# Patient Record
Sex: Female | Born: 1970
Health system: Southern US, Community
[De-identification: ages and names within clinical notes are randomized; demographics above are authoritative.]

## PROBLEM LIST (undated history)

## (undated) DIAGNOSIS — F329 Major depressive disorder, single episode, unspecified: Secondary | ICD-10-CM

## (undated) DIAGNOSIS — D259 Leiomyoma of uterus, unspecified: Secondary | ICD-10-CM

## (undated) DIAGNOSIS — N979 Female infertility, unspecified: Secondary | ICD-10-CM

## (undated) DIAGNOSIS — F32A Depression, unspecified: Secondary | ICD-10-CM

## (undated) DIAGNOSIS — E739 Lactose intolerance, unspecified: Secondary | ICD-10-CM

## (undated) DIAGNOSIS — K589 Irritable bowel syndrome without diarrhea: Secondary | ICD-10-CM

## (undated) DIAGNOSIS — I341 Nonrheumatic mitral (valve) prolapse: Secondary | ICD-10-CM

## (undated) DIAGNOSIS — M5136 Other intervertebral disc degeneration, lumbar region: Secondary | ICD-10-CM

## (undated) DIAGNOSIS — M51369 Other intervertebral disc degeneration, lumbar region without mention of lumbar back pain or lower extremity pain: Secondary | ICD-10-CM

## (undated) HISTORY — PX: WISDOM TOOTH EXTRACTION: SHX21

## (undated) HISTORY — DX: Irritable bowel syndrome, unspecified: K58.9

## (undated) HISTORY — DX: Depression, unspecified: F32.A

## (undated) HISTORY — PX: LAPAROSCOPY: SHX197

## (undated) HISTORY — DX: Lactose intolerance, unspecified: E73.9

## (undated) HISTORY — DX: Major depressive disorder, single episode, unspecified: F32.9

## (undated) HISTORY — DX: Leiomyoma of uterus, unspecified: D25.9

## (undated) HISTORY — DX: Nonrheumatic mitral (valve) prolapse: I34.1

## (undated) HISTORY — DX: Female infertility, unspecified: N97.9

---

## 2001-09-02 ENCOUNTER — Ambulatory Visit (HOSPITAL_COMMUNITY): Admission: RE | Admit: 2001-09-02 | Discharge: 2001-09-02 | Payer: Self-pay | Admitting: Obstetrics and Gynecology

## 2001-09-02 ENCOUNTER — Encounter: Payer: Self-pay | Admitting: Obstetrics and Gynecology

## 2002-02-04 ENCOUNTER — Inpatient Hospital Stay (HOSPITAL_COMMUNITY): Admission: AD | Admit: 2002-02-04 | Discharge: 2002-02-07 | Payer: Self-pay | Admitting: Obstetrics and Gynecology

## 2002-03-18 ENCOUNTER — Other Ambulatory Visit: Admission: RE | Admit: 2002-03-18 | Discharge: 2002-03-18 | Payer: Self-pay | Admitting: Obstetrics and Gynecology

## 2003-06-24 ENCOUNTER — Other Ambulatory Visit: Admission: RE | Admit: 2003-06-24 | Discharge: 2003-06-24 | Payer: Self-pay | Admitting: Obstetrics and Gynecology

## 2004-10-21 ENCOUNTER — Other Ambulatory Visit: Admission: RE | Admit: 2004-10-21 | Discharge: 2004-10-21 | Payer: Self-pay | Admitting: Obstetrics and Gynecology

## 2006-01-19 ENCOUNTER — Other Ambulatory Visit: Admission: RE | Admit: 2006-01-19 | Discharge: 2006-01-19 | Payer: Self-pay | Admitting: Obstetrics and Gynecology

## 2008-06-26 ENCOUNTER — Ambulatory Visit: Payer: Self-pay | Admitting: Internal Medicine

## 2008-10-16 ENCOUNTER — Ambulatory Visit: Payer: Self-pay | Admitting: Internal Medicine

## 2009-01-29 ENCOUNTER — Ambulatory Visit: Payer: Self-pay | Admitting: Internal Medicine

## 2009-03-12 ENCOUNTER — Ambulatory Visit: Payer: Self-pay

## 2009-04-22 ENCOUNTER — Ambulatory Visit: Payer: Self-pay

## 2009-05-06 ENCOUNTER — Ambulatory Visit: Payer: Self-pay

## 2010-11-17 ENCOUNTER — Ambulatory Visit: Payer: Self-pay | Admitting: Cardiovascular Disease

## 2010-11-29 ENCOUNTER — Encounter: Payer: Self-pay | Admitting: Cardiovascular Disease

## 2010-11-29 ENCOUNTER — Ambulatory Visit (INDEPENDENT_AMBULATORY_CARE_PROVIDER_SITE_OTHER): Payer: Managed Care, Other (non HMO) | Admitting: Cardiovascular Disease

## 2010-11-29 DIAGNOSIS — R002 Palpitations: Secondary | ICD-10-CM | POA: Insufficient documentation

## 2010-11-29 DIAGNOSIS — I059 Rheumatic mitral valve disease, unspecified: Secondary | ICD-10-CM | POA: Insufficient documentation

## 2010-11-29 DIAGNOSIS — R079 Chest pain, unspecified: Secondary | ICD-10-CM

## 2010-12-02 ENCOUNTER — Ambulatory Visit: Payer: Self-pay | Admitting: Cardiovascular Disease

## 2010-12-02 DIAGNOSIS — R002 Palpitations: Secondary | ICD-10-CM

## 2010-12-06 NOTE — Assessment & Plan Note (Signed)
Summary: Ref Dr. Darrick Huntsman Dx. MVP/ recurrent atypical chest pain   Visit Type:  Initial Consult Primary Provider:  Dr. Darrick Huntsman  CC:  pt is here to establish care and dx with mitral valve prolapse as a child. c/o chest pain. Denies SOB.Suzanne Foster  History of Present Illness: 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 Darrick Huntsman, who presents by referral for evaluation of chest pain and palpitations.  She reports that her symptoms of chest pain started a long time ago. More recently, her episodes have been increasing in frequency. It used to be once every several months, then once a month, now once a week. She describes it as brief, lasting several seconds. Typically comes on at rest, 8/10 in intensity. It is a shooting-type sharp pain over the left breast that is deep. He can take her breath away. Symptoms are worse with deep inspiration. She denies any cough, lightheadedness, diaphoresis or radiating pain.  She also has palpitations that have been on daily basis. Typically comes on it rest. She's had these for a long time. Denies any tachycardia palpitations him a chest extra beats.  She denies excess caffeine, normal thyroid in October last year. She reports parents have atrial fibrillation. She also reports a remote history of mitral valve prolapse.  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  The  patient denies fever, weight loss, weight gain, vision loss, decreased hearing, hoarseness, syncope, dyspnea on exertion, peripheral edema, prolonged cough, abdominal pain, incontinence, muscle weakness, depression, and enlarged lymph nodes.         palpitations  Physical Exam  General:  height 5 foot 10, weight 193 pounds, blood pressure 120/60, pulse 89, BMI 27.8  Head:  normocephalic and atraumatic Eyes:  PERRLA/EOM intact; conjunctiva and lids normal. Neck:  Neck supple, no JVD. No masses, thyromegaly or abnormal cervical nodes. Lungs:  Clear bilaterally to auscultation and percussion. Heart:  Non-displaced PMI, chest non-tender; regular rate and rhythm, S1, S2 without murmurs, rubs or gallops. Carotid upstroke normal, no bruit. Pedals normal pulses. No edema, no varicosities. Abdomen:  Bowel sounds positive; abdomen soft and non-tender without masses Msk:  Back normal, normal gait. Muscle strength and tone normal. Pulses:  pulses normal in all 4 extremities Extremities:  No clubbing or cyanosis. Neurologic:  Alert and oriented x 3. Skin:  Intact without lesions or rashes. Psych:  Normal affect.   Impression & Recommendations:  Problem # 1:  PALPITATIONS (ICD-785.1) She does have palpitations and we have ordered a Holter monitor. I suspect she could have APCs or PVCs. She is not interested in low-dose beta blocker at this time. She does have a history of mitral valve prolapse.   Orders: Echocardiogram (Echo) Holter (Holter)  Problem # 2:  CHEST PAIN-UNSPECIFIED (ICD-786.50) Etiology of her chest pain is uncertain. There certainly numerous things on the differential including atelectasis, chest wall pain/musculoskeletal issues, coronary spasm,  etc. She does not have significant risk factors for coronary artery disease. And she is otherwise active with no symptoms. Will hold off on any stress testing at this time. Given her history of mitral valve prolapse and recent  palpitations, an echocardiogram has been ordered. These will be done at the [facility] as she is employed there.  Problem # 3:  MITRAL VALVE PROLAPSE (ICD-424.0) echocardiogram will be ordered to evaluate her mitral valve and to exclude progression of her valve prolapse.  Orders: Echocardiogram (Echo)  New Orders:     1)  Echocardiogram (Echo)     2)  Holter (Holter)   Patient Instructions: 1)  Your physician recommends that you follow up as needed. 2)  Your physician recommends that you continue on your current medications as directed. Please refer to the Current Medication list given to you today. 3)  TO BE DONE AT [facility]: Your physician has requested that you have an echocardiogram.  Echocardiography is a painless test that uses sound waves to create images of your heart. It provides your doctor with information about the size and shape of your heart and how well your heart's chambers and valves are working.  This procedure takes approximately one hour. There are no restrictions for this procedure. 4)  WE WILL ORDER AT [facility]: Your physician has recommended that you wear a holter monitor.  Holter monitors are medical devices that record the heart's electrical activity. Doctors most often use these monitors to diagnose arrhythmias. Arrhythmias are problems with the speed or rhythm of the heartbeat. The monitor is a small, portable device. You can wear one while you do your normal daily activities. This is usually used to diagnose what is causing palpitations/syncope (passing out).

## 2010-12-20 ENCOUNTER — Telehealth: Payer: Self-pay | Admitting: Cardiovascular Disease

## 2010-12-20 NOTE — Telephone Encounter (Signed)
Would like results of ECHO and Holter Monitor.

## 2010-12-20 NOTE — Telephone Encounter (Signed)
Pt notified that echo was essentially normal and that Holter monitor was overall NSR with rare PVC and short run of sinus tach. Notified pt unless she was symptomatic and wanted to schedule office visit with Dr. Mariah Milling to be started on medication then nothing needed to be done. Pt states she will monitor symptoms for now and will call if wants to schedule appt.

## 2011-01-05 ENCOUNTER — Encounter: Payer: Self-pay | Admitting: Cardiovascular Disease

## 2011-01-17 ENCOUNTER — Encounter: Payer: Self-pay | Admitting: Cardiovascular Disease

## 2012-05-17 ENCOUNTER — Ambulatory Visit: Payer: Managed Care, Other (non HMO) | Admitting: Gastroenterology

## 2012-06-07 ENCOUNTER — Other Ambulatory Visit (INDEPENDENT_AMBULATORY_CARE_PROVIDER_SITE_OTHER): Payer: Managed Care, Other (non HMO)

## 2012-06-07 ENCOUNTER — Ambulatory Visit (INDEPENDENT_AMBULATORY_CARE_PROVIDER_SITE_OTHER): Payer: Managed Care, Other (non HMO) | Admitting: Gastroenterology

## 2012-06-07 ENCOUNTER — Encounter: Payer: Self-pay | Admitting: Gastroenterology

## 2012-06-07 VITALS — BP 118/72 | HR 80 | Ht 69.5 in | Wt 190.5 lb

## 2012-06-07 DIAGNOSIS — R197 Diarrhea, unspecified: Secondary | ICD-10-CM

## 2012-06-07 DIAGNOSIS — R109 Unspecified abdominal pain: Secondary | ICD-10-CM

## 2012-06-07 LAB — CBC WITH DIFFERENTIAL/PLATELET
Basophils Relative: 0.6 % (ref 0.0–3.0)
Eosinophils Absolute: 0.1 10*3/uL (ref 0.0–0.7)
Eosinophils Relative: 1.5 % (ref 0.0–5.0)
Lymphocytes Relative: 19.7 % (ref 12.0–46.0)
MCHC: 33.5 g/dL (ref 30.0–36.0)
Monocytes Relative: 4.3 % (ref 3.0–12.0)
Neutrophils Relative %: 73.9 % (ref 43.0–77.0)
RBC: 4.62 Mil/uL (ref 3.87–5.11)
WBC: 5.9 10*3/uL (ref 4.5–10.5)

## 2012-06-07 LAB — COMPREHENSIVE METABOLIC PANEL
ALT: 13 U/L (ref 0–35)
AST: 20 U/L (ref 0–37)
Albumin: 4 g/dL (ref 3.5–5.2)
Alkaline Phosphatase: 44 U/L (ref 39–117)
BUN: 13 mg/dL (ref 6–23)
CO2: 25 mEq/L (ref 19–32)
Calcium: 9.2 mg/dL (ref 8.4–10.5)
Chloride: 104 mEq/L (ref 96–112)
Creatinine, Ser: 0.9 mg/dL (ref 0.4–1.2)
GFR: 78.73 mL/min (ref >60.00)
Glucose, Bld: 114 mg/dL — ABNORMAL HIGH (ref 70–99)
Potassium: 4.1 mEq/L (ref 3.5–5.1)
Sodium: 138 mEq/L (ref 135–145)
Total Bilirubin: 0.5 mg/dL (ref 0.3–1.2)
Total Protein: 7.2 g/dL (ref 6.0–8.3)

## 2012-06-07 NOTE — Patient Instructions (Addendum)
You will have labs checked today in the basement lab.  Please head down after you check out with the front desk  (cbc, cmet, celiac sprue panel, tsh). Await the above results to decide next steps.

## 2012-06-07 NOTE — Progress Notes (Signed)
HPI: This is a    very pleasant 41 year old woman whom I am meeting for the first time today.  She is on Prozac and a #1 side effect of this medicine is nausea  About 20 years ago she saw a GI in [place], sigmoidoscopiy and barium done. She was told she had lactose intolerance and IBS.  Continues to have same symptoms:  Post prandial severe abd cramping, gassy diarrhea, then symptoms gone.  Has had stressfull year with exacerbation of symptoms.  Also new burning and dull ache in epigastrium, usually also post prandial.  Periumbilical sharp pain.  Has been eating greek yogurt and most symptoms have improved almost cancelled the appt.  THis AM ate a biscquit  She knows she can eat some dairy.  Lactaid trials DON'T really help.   Infrequent blood in stool, red, every 1-2 months. Feels it is a hemorrhoid, usually associated with tearing.  Usually has one BM every morning.   The episodes occur 3 times a week.  Has not   Her brother diagnosed with gluten allergy recently.   Review of systems: Pertinent positive and negative review of systems were noted in the above HPI section. Complete review of systems was performed and was otherwise normal.    Past Medical History  Diagnosis Date  . Infertility, female   . Mitral valve prolapse     dx age 61, had echo done 10 years ago in [facility]  . IBS (irritable bowel syndrome)   . Lactose intolerance   . Uterine fibroid   . Depression     Past Surgical History  Procedure Date  . Cesarean section     x1   . Laparoscopy     x 2 for endometriosis, and ovarian cysts  . Wisdom tooth extraction     Current Outpatient Prescriptions  Medication Sig Dispense Refill  . FLUoxetine (PROZAC) 20 MG capsule Take 40 mg by mouth daily.       . Norethindrone-Ethinyl Estradiol-Fe (GENERESS FE) 0.8-25 MG-MCG tablet Chew 1 tablet by mouth daily.        Allergies as of 06/07/2012  . (No Known Allergies)    Family History  Problem Relation Age  of Onset  . Atrial fibrillation Mother   . Atrial fibrillation Father     CHF, post ablative  . Breast cancer Paternal Grandmother   . Uterine cancer Paternal Grandmother   . Thyroid cancer Paternal Grandmother     History   Social History  . Marital Status: Married    Spouse Name: N/A    Number of Children: 1  . Years of Education: N/A   Occupational History  . Social Worker/Care Mgr    Social History Main Topics  . Smoking status: Never Smoker   . Smokeless tobacco: Never Used  . Alcohol Use: Yes     1 every 6 months  . Drug Use: No  . Sexually Active: Not on file   Other Topics Concern  . Not on file   Social History Narrative  . No narrative on file       Physical Exam: BP 118/72  Pulse 80  Ht 5' 9.5" (1.765 m)  Wt 190 lb 8 oz (86.41 kg)  BMI 27.73 kg/m2  LMP 05/17/2012 Constitutional: generally well-appearing Psychiatric: alert and oriented x3 Eyes: extraocular movements intact Mouth: oral pharynx moist, no lesions Neck: supple no lymphadenopathy Cardiovascular: heart regular rate and rhythm Lungs: clear to auscultation bilaterally Abdomen: soft, nontender, nondistended, no obvious  ascites, no peritoneal signs, normal bowel sounds Extremities: no lower extremity edema bilaterally Skin: no lesions on visible extremities    Assessment and plan: 41 y.o. female with  intermittent abdominal pain, diarrhea, also intermittent rectal bleeding  This may simply be IBS related. Of interest, her brother was recently diagnosed with gluten allergy and so perhaps her symptoms could be attributed to celiac sprue. She is going to have a basic set of labs today including CBC, complete metabolic profile, thyroid testing and also celiac sprue panel. Pending those results I would proceed either with upper endoscopy to confirm celiac sprue or colonoscopy if the results are all unrevealing. She did mention some new epigastric intermittent postprandial pain. Perhaps this is  gallbladder related however that would not explain the rest of her symptoms and    we will keep that in the back of our minds for now.

## 2012-06-10 LAB — CELIAC PANEL 10
Endomysial Screen: NEGATIVE
Gliadin IgA: 8.1 U/mL (ref <20)
IgA: 97 mg/dL (ref 69–380)

## 2012-06-20 ENCOUNTER — Other Ambulatory Visit (INDEPENDENT_AMBULATORY_CARE_PROVIDER_SITE_OTHER): Payer: Self-pay | Admitting: Internal Medicine

## 2013-06-27 ENCOUNTER — Other Ambulatory Visit (INDEPENDENT_AMBULATORY_CARE_PROVIDER_SITE_OTHER): Payer: Self-pay | Admitting: Internal Medicine

## 2013-09-04 ENCOUNTER — Ambulatory Visit: Payer: Self-pay

## 2013-09-15 ENCOUNTER — Telehealth: Payer: Self-pay | Admitting: *Deleted

## 2013-09-15 NOTE — Telephone Encounter (Signed)
Patient called and she had blurry vision, dizzy yesterday bp 140/90 pulse was 61 normally in the 80's.  158/92 this morning. Please advise.

## 2013-09-15 NOTE — Telephone Encounter (Signed)
Left voicemail for pt that we have not seen her since 11/2010 and she will need to make an appt.

## 2014-05-27 ENCOUNTER — Ambulatory Visit: Payer: Self-pay

## 2014-06-03 ENCOUNTER — Ambulatory Visit: Payer: Self-pay | Admitting: Sports Medicine

## 2014-06-18 ENCOUNTER — Ambulatory Visit: Payer: Self-pay

## 2015-03-23 ENCOUNTER — Other Ambulatory Visit: Payer: Self-pay | Admitting: Obstetrics and Gynecology

## 2015-03-23 DIAGNOSIS — Z1231 Encounter for screening mammogram for malignant neoplasm of breast: Secondary | ICD-10-CM

## 2015-04-09 ENCOUNTER — Ambulatory Visit: Payer: Self-pay

## 2015-04-13 ENCOUNTER — Other Ambulatory Visit: Payer: Self-pay | Admitting: Obstetrics and Gynecology

## 2015-04-13 ENCOUNTER — Ambulatory Visit
Admission: RE | Admit: 2015-04-13 | Discharge: 2015-04-13 | Disposition: A | Discharge status: Home / Self Care | Payer: 59 | Source: Ambulatory Visit | Attending: Obstetrics and Gynecology | Admitting: Obstetrics and Gynecology

## 2015-04-13 DIAGNOSIS — Z1231 Encounter for screening mammogram for malignant neoplasm of breast: Secondary | ICD-10-CM

## 2015-12-06 DIAGNOSIS — F331 Major depressive disorder, recurrent, moderate: Secondary | ICD-10-CM | POA: Diagnosis not present

## 2016-01-04 DIAGNOSIS — F331 Major depressive disorder, recurrent, moderate: Secondary | ICD-10-CM | POA: Diagnosis not present

## 2016-02-15 DIAGNOSIS — F331 Major depressive disorder, recurrent, moderate: Secondary | ICD-10-CM | POA: Diagnosis not present

## 2016-03-24 ENCOUNTER — Other Ambulatory Visit: Payer: Self-pay | Admitting: Obstetrics and Gynecology

## 2016-03-24 DIAGNOSIS — Z1231 Encounter for screening mammogram for malignant neoplasm of breast: Secondary | ICD-10-CM

## 2016-04-11 DIAGNOSIS — K219 Gastro-esophageal reflux disease without esophagitis: Secondary | ICD-10-CM | POA: Diagnosis not present

## 2016-04-11 DIAGNOSIS — R1013 Epigastric pain: Secondary | ICD-10-CM | POA: Diagnosis not present

## 2016-04-11 DIAGNOSIS — Z1211 Encounter for screening for malignant neoplasm of colon: Secondary | ICD-10-CM | POA: Diagnosis not present

## 2016-04-11 DIAGNOSIS — R1310 Dysphagia, unspecified: Secondary | ICD-10-CM | POA: Diagnosis not present

## 2016-04-13 ENCOUNTER — Ambulatory Visit: Payer: 59 | Attending: Obstetrics and Gynecology

## 2016-04-20 ENCOUNTER — Ambulatory Visit
Admission: RE | Admit: 2016-04-20 | Discharge: 2016-04-20 | Disposition: A | Discharge status: Home / Self Care | Payer: 59 | Source: Ambulatory Visit | Attending: Obstetrics and Gynecology | Admitting: Obstetrics and Gynecology

## 2016-04-20 ENCOUNTER — Other Ambulatory Visit: Payer: Self-pay | Admitting: Obstetrics and Gynecology

## 2016-04-20 DIAGNOSIS — Z1231 Encounter for screening mammogram for malignant neoplasm of breast: Secondary | ICD-10-CM

## 2016-05-09 DIAGNOSIS — F331 Major depressive disorder, recurrent, moderate: Secondary | ICD-10-CM | POA: Diagnosis not present

## 2016-05-19 ENCOUNTER — Encounter (HOSPITAL_COMMUNITY): Payer: Self-pay

## 2016-05-19 ENCOUNTER — Ambulatory Visit (HOSPITAL_COMMUNITY): Admit: 2016-05-19 | Payer: 59 | Admitting: Gastroenterology

## 2016-05-19 SURGERY — ESOPHAGOGASTRODUODENOSCOPY (EGD) WITH PROPOFOL
Anesthesia: Monitor Anesthesia Care

## 2016-07-03 DIAGNOSIS — N920 Excessive and frequent menstruation with regular cycle: Secondary | ICD-10-CM | POA: Diagnosis not present

## 2016-07-03 DIAGNOSIS — N939 Abnormal uterine and vaginal bleeding, unspecified: Secondary | ICD-10-CM | POA: Diagnosis not present

## 2016-07-03 DIAGNOSIS — Z0000 Encounter for general adult medical examination without abnormal findings: Secondary | ICD-10-CM | POA: Diagnosis not present

## 2016-07-03 DIAGNOSIS — Z01419 Encounter for gynecological examination (general) (routine) without abnormal findings: Secondary | ICD-10-CM | POA: Diagnosis not present

## 2016-07-03 DIAGNOSIS — N946 Dysmenorrhea, unspecified: Secondary | ICD-10-CM | POA: Diagnosis not present

## 2016-07-03 DIAGNOSIS — Z6829 Body mass index (BMI) 29.0-29.9, adult: Secondary | ICD-10-CM | POA: Diagnosis not present

## 2016-07-13 ENCOUNTER — Other Ambulatory Visit: Payer: Self-pay | Admitting: *Deleted

## 2016-07-13 ENCOUNTER — Ambulatory Visit (INDEPENDENT_AMBULATORY_CARE_PROVIDER_SITE_OTHER): Payer: 59

## 2016-07-13 ENCOUNTER — Encounter: Payer: Self-pay | Admitting: Podiatry

## 2016-07-13 ENCOUNTER — Ambulatory Visit (INDEPENDENT_AMBULATORY_CARE_PROVIDER_SITE_OTHER): Payer: 59 | Admitting: Podiatry

## 2016-07-13 DIAGNOSIS — M722 Plantar fascial fibromatosis: Secondary | ICD-10-CM

## 2016-07-13 DIAGNOSIS — M79672 Pain in left foot: Secondary | ICD-10-CM | POA: Diagnosis not present

## 2016-07-13 DIAGNOSIS — L603 Nail dystrophy: Secondary | ICD-10-CM

## 2016-07-13 DIAGNOSIS — M79671 Pain in right foot: Secondary | ICD-10-CM | POA: Diagnosis not present

## 2016-07-13 MED ORDER — METHYLPREDNISOLONE 4 MG PO TBPK: ORAL_TABLET | ORAL | 0 refills | Status: DC

## 2016-07-13 MED ORDER — MELOXICAM 15 MG PO TABS: 15.0000 mg | ORAL_TABLET | Freq: Every day | ORAL | 3 refills | Status: DC

## 2016-07-13 NOTE — Patient Instructions (Signed)

## 2016-07-13 NOTE — Progress Notes (Signed)
   Subjective:    Patient ID: Suzanne Foster, female    DOB: 02-29-1971, 45 y.o.   MRN: [id number]  HPI: She presents today with concern about the hallux nail left. States it has been thickened for quite some time and use an over-the-counter counter agent called Curasol which seemed to have resolved the problem. She states that however recently she has noted that he seems to be returning. She's also complaining of bilateral heel and arch pain. She states is been aching for the past few weeks mornings are particularly bad she feels that it is primarily because of her increased exercise regimen. She states that she has felt better since she stopped exercising and she's been taking ibuprofen as needed.  Review of Systems  Gastrointestinal: Positive for abdominal pain.  Musculoskeletal: Positive for arthralgias.  Allergic/Immunologic: Positive for environmental allergies.  All other systems reviewed and are negative.      Objective:   Physical Exam: She presents today vital signs stable alert and oriented 3 pulses are strongly palpable. Neurologic sensorium is intact. Deep tendon reflexes are intact. Muscle strength +5 over 5 dorsiflexion plantar flexors and inverters everters onto the musculature is intact. Orthopedic evaluation of his resolved joints distal to the ankle range of motion without crepitation. Cutaneous evaluation demonstrates a well-hydrated cutis possible onychomycosis to the distal lateral nail margin without discoloration. She has pain on palpation medial calcaneal tubercles bilateral. Radiographs taken today do not demonstrate any type of major osseous abnormalities of the soft tissue increase in density of the plantar fascia cranial insertion sites.        Assessment & Plan:  Assessment: Pain in limb secondary to onychomycosis hallux left. Pain in limb plantar fasciitis bilateral.  Plan: Injected the bilateral heels today and placed her in bilateral plantar fascia braces.  Discussed appropriate shoe gear Street #Zeiss therapy and shoe modifications. I also recommended that she continue the over-the-counter treatment with Curasol until I follow up with her in 1 month. If not improved at that time we'll take samples sent it to the lab.

## 2016-07-14 ENCOUNTER — Other Ambulatory Visit: Payer: Self-pay | Admitting: Gastroenterology

## 2016-07-27 ENCOUNTER — Encounter (HOSPITAL_COMMUNITY): Payer: Self-pay | Admitting: *Deleted

## 2016-08-01 ENCOUNTER — Other Ambulatory Visit: Payer: Self-pay | Admitting: Gastroenterology

## 2016-08-02 ENCOUNTER — Encounter (HOSPITAL_COMMUNITY)
Admission: RE | Disposition: A | Discharge status: Home / Self Care | Payer: Self-pay | Source: Ambulatory Visit | Attending: Gastroenterology

## 2016-08-02 ENCOUNTER — Ambulatory Visit (HOSPITAL_COMMUNITY): Payer: 59 | Admitting: Registered Nurse

## 2016-08-02 ENCOUNTER — Encounter (HOSPITAL_COMMUNITY): Payer: Self-pay

## 2016-08-02 ENCOUNTER — Ambulatory Visit (HOSPITAL_COMMUNITY)
Admission: RE | Admit: 2016-08-02 | Discharge: 2016-08-02 | Disposition: A | Discharge status: Home / Self Care | Payer: 59 | Source: Ambulatory Visit | Attending: Gastroenterology | Admitting: Gastroenterology

## 2016-08-02 DIAGNOSIS — R1310 Dysphagia, unspecified: Secondary | ICD-10-CM | POA: Diagnosis not present

## 2016-08-02 DIAGNOSIS — K219 Gastro-esophageal reflux disease without esophagitis: Secondary | ICD-10-CM | POA: Insufficient documentation

## 2016-08-02 DIAGNOSIS — Z79899 Other long term (current) drug therapy: Secondary | ICD-10-CM | POA: Insufficient documentation

## 2016-08-02 DIAGNOSIS — K2970 Gastritis, unspecified, without bleeding: Secondary | ICD-10-CM | POA: Insufficient documentation

## 2016-08-02 HISTORY — PX: ESOPHAGOGASTRODUODENOSCOPY (EGD) WITH PROPOFOL: SHX5813

## 2016-08-02 HISTORY — DX: Other intervertebral disc degeneration, lumbar region: M51.36

## 2016-08-02 HISTORY — PX: BALLOON DILATION: SHX5330

## 2016-08-02 HISTORY — DX: Other intervertebral disc degeneration, lumbar region without mention of lumbar back pain or lower extremity pain: M51.369

## 2016-08-02 SURGERY — ESOPHAGOGASTRODUODENOSCOPY (EGD) WITH PROPOFOL
Anesthesia: Monitor Anesthesia Care

## 2016-08-02 MED ORDER — LIDOCAINE 2% (20 MG/ML) 5 ML SYRINGE
INTRAMUSCULAR | Status: AC
  Filled 2016-08-02: qty 5

## 2016-08-02 MED ORDER — PROPOFOL 10 MG/ML IV BOLUS
INTRAVENOUS | Status: AC
  Filled 2016-08-02: qty 20

## 2016-08-02 MED ORDER — PROPOFOL 10 MG/ML IV BOLUS
INTRAVENOUS | Status: DC | PRN
  Administered 2016-08-02 (×2): 20 mg via INTRAVENOUS
  Administered 2016-08-02 (×2): 40 mg via INTRAVENOUS

## 2016-08-02 MED ORDER — LACTATED RINGERS IV SOLN
INTRAVENOUS | Status: DC | PRN
  Administered 2016-08-02: 09:00:00 via INTRAVENOUS

## 2016-08-02 MED ORDER — PROPOFOL 10 MG/ML IV BOLUS
INTRAVENOUS | Status: AC
  Filled 2016-08-02: qty 40

## 2016-08-02 MED ORDER — LIDOCAINE 2% (20 MG/ML) 5 ML SYRINGE
INTRAMUSCULAR | Status: DC | PRN
  Administered 2016-08-02: 100 mg via INTRAVENOUS

## 2016-08-02 MED ORDER — BUTAMBEN-TETRACAINE-BENZOCAINE 2-2-14 % EX AERO
INHALATION_SPRAY | CUTANEOUS | Status: DC | PRN
Start: 2016-08-02 — End: 2016-08-02
  Administered 2016-08-02: 1 via TOPICAL

## 2016-08-02 MED ORDER — PROPOFOL 500 MG/50ML IV EMUL
INTRAVENOUS | Status: DC | PRN
  Administered 2016-08-02: 130 ug/kg/min via INTRAVENOUS

## 2016-08-02 SURGICAL SUPPLY — 15 items

## 2016-08-02 NOTE — Op Note (Signed)
[facility] Patient Name: Suzanne Foster Procedure Date: 08/02/2016 MRN: [id number] Attending MD: Arta Silence , MD Date of Birth: [birth year] CSN: [id number] Age: 45 Admit Type: Outpatient Procedure:                Upper GI endoscopy Indications:              Dysphagia, Suspected gastro-esophageal reflux                            disease Providers:                Arta Silence, MD, Jobe Igo, RN, Ralene Bathe, Technician, Courtney Heys. Arm[place]ead, CRNA Referring MD:              Medicines:                Monitored Anesthesia Care Complications:            No immediate complications. Estimated Blood Loss:     Estimated blood loss: none. Procedure:                Pre-Anesthesia Assessment:                           - Prior to the procedure, a History and Physical                            was performed, and patient medications and                            allergies were reviewed. The patient's tolerance of                            previous anesthesia was also reviewed. The risks                            and benefits of the procedure and the sedation                            options and risks were discussed with the patient.                            All questions were answered, and informed consent                            was obtained. Prior Anticoagulants: The patient has                            taken no previous anticoagulant or antiplatelet                            agents. ASA Grade Assessment: II - A patient with                            mild  systemic disease. After reviewing the risks                            and benefits, the patient was deemed in                            satisfactory condition to undergo the procedure.                           After obtaining informed consent, the endoscope was                            passed under direct vision. Throughout the                            procedure, the  patient's blood pressure, pulse, and                            oxygen saturations were monitored continuously. The                            Endoscope was introduced through the mouth, and                            advanced to the second part of duodenum. The upper                            GI endoscopy was accomplished without difficulty.                            The patient tolerated the procedure well. Scope In: Scope Out: Findings:      The examined esophagus was normal. No esophageal mass, stricture,       varices or Barrett's mucosa was identified. No mucosal features       suggestive of eosinophilic esophagitis were identified.      Patchy minimal inflammation was found in the prepyloric region of the       stomach.      The exam of the stomach was otherwise normal.      The duodenal bulb, first portion of the duodenum and second portion of       the duodenum were normal. Impression:               - Normal esophagus. Suspect dysphagia is                            GERD-mediated.                           - Gastritis.                           - Normal duodenal bulb, first portion of the                            duodenum and second portion of the duodenum. Moderate Sedation:  None Recommendation:           - Patient has a contact number available for                            emergencies. The signs and symptoms of potential                            delayed complications were discussed with the                            patient. Return to normal activities tomorrow.                            Written discharge instructions were provided to the                            patient.                           - Discharge patient to home (ambulatory).                           - Resume previous diet today.                           - Follow an antireflux regimen indefinitely.                           - Continue present medications.                           - Return  to GI clinic PRN.                           - Return to referring physician as previously                            scheduled. Procedure Code(s):        --- Professional ---                           [phone], Esophagogastroduodenoscopy, flexible,                            transoral; diagnostic, including collection of                            specimen(s) by brushing or washing, when performed                            (separate procedure) Diagnosis Code(s):        --- Professional ---                           K29.70, Gastritis, unspecified, without bleeding  R13.10, Dysphagia, unspecified CPT copyright 2016 American Medical Association. All rights reserved. The codes documented in this report are preliminary and upon coder review may  be revised to meet current compliance requirements. Arta Silence, MD 08/02/2016 9:59:54 AM This report has been signed electronically. Number of Addenda: 0

## 2016-08-02 NOTE — H&P (Signed)
Patient interval history reviewed.  Patient examined again.  There has been no change from documented H/P dated 08/01/16 (scanned into chart from our office) except as documented above.  Assessment:  1.  GERD. 2.  Intermittent solid-predominant dysphagia. 3.  Globus sensation.  Plan:  1.  Endoscopy with possible esophageal dilatation. 2.  Risks (bleeding, infection, bowel perforation that could require surgery, sedation-related changes in cardiopulmonary systems), benefits (identification and possible treatment of source of symptoms, exclusion of certain causes of symptoms), and alternatives (watchful waiting, radiographic imaging studies, empiric medical treatment) of upper endoscopy with possible esophageal dilatation (EGD +/- DIL) were explained to patient/family in detail and patient wishes to proceed.

## 2016-08-02 NOTE — Transfer of Care (Signed)
Immediate Anesthesia Transfer of Care Note  Patient: Suzanne Foster  Procedure(s) Performed: Procedure(s): ESOPHAGOGASTRODUODENOSCOPY (EGD) WITH PROPOFOL (N/A) BALLOON DILATION (N/A)  Patient Location: PACU and Endoscopy Unit  Anesthesia Type:MAC  Level of Consciousness: awake, alert , oriented and patient cooperative  Airway & Oxygen Therapy: Patient Spontanous Breathing and Patient connected to nasal cannula oxygen  Post-op Assessment: Report given to RN, Post -op Vital signs reviewed and stable and Patient moving all extremities  Post vital signs: Reviewed and stable  Last Vitals:  Vitals:   08/02/16 0825  BP: 128/64  Pulse: 73  Resp: 18  Temp: 36.7 C    Last Pain:  Vitals:   08/02/16 0825  TempSrc: Oral         Complications: No apparent anesthesia complications

## 2016-08-02 NOTE — Anesthesia Preprocedure Evaluation (Addendum)
Anesthesia Evaluation  Patient identified by MRN, date of birth, ID band Patient awake    Reviewed: Allergy & Precautions, NPO status , Patient's Chart, lab work & pertinent test results  Airway Mallampati: II  TM Distance: >3 FB Neck ROM: Full    Dental no notable dental hx.    Pulmonary neg pulmonary ROS,    Pulmonary exam normal breath sounds clear to auscultation       Cardiovascular Normal cardiovascular exam+ Valvular Problems/Murmurs MVP  Rhythm:Regular Rate:Normal     Neuro/Psych negative neurological ROS  negative psych ROS   GI/Hepatic negative GI ROS, Neg liver ROS,   Endo/Other  negative endocrine ROS  Renal/GU negative Renal ROS     Musculoskeletal  (+) Arthritis ,   Abdominal   Peds  Hematology negative hematology ROS (+)   Anesthesia Other Findings   Reproductive/Obstetrics negative OB ROS                             Anesthesia Physical Anesthesia Plan  ASA: II  Anesthesia Plan: MAC   Post-op Pain Management:    Induction:   Airway Management Planned:   Additional Equipment:   Intra-op Plan:   Post-operative Plan:   Informed Consent: I have reviewed the patients History and Physical, chart, labs and discussed the procedure including the risks, benefits and alternatives for the proposed anesthesia with the patient or authorized representative who has indicated his/her understanding and acceptance.   Dental advisory given  Plan Discussed with: CRNA  Anesthesia Plan Comments:         Anesthesia Quick Evaluation

## 2016-08-02 NOTE — Anesthesia Postprocedure Evaluation (Signed)
Anesthesia Post Note  Patient: LIANI CARIS  Procedure(s) Performed: Procedure(s) (LRB): ESOPHAGOGASTRODUODENOSCOPY (EGD) WITH PROPOFOL (N/A) BALLOON DILATION (N/A)  Patient location during evaluation: PACU Anesthesia Type: MAC Level of consciousness: awake and alert Pain management: pain level controlled Vital Signs Assessment: post-procedure vital signs reviewed and stable Respiratory status: spontaneous breathing Cardiovascular status: stable Anesthetic complications: no    Last Vitals:  Vitals:   08/02/16 1010 08/02/16 1016  BP: (!) 104/45 (!) 105/47  Pulse: 62 63  Resp:    Temp:      Last Pain:  Vitals:   08/02/16 0952  TempSrc: Oral                 Nolon Nations

## 2016-08-02 NOTE — Discharge Instructions (Signed)
Endoscopy Care After Please read the instructions outlined below and refer to this sheet in the next few weeks. These discharge instructions provide you with general information on caring for yourself after you leave the hospital. Your doctor may also give you specific instructions. While your treatment has been planned according to the most current medical practices available, unavoidable complications occasionally occur. If you have any problems or questions after discharge, please call Dr. Paulita Fujita [facility]) at [phone].  HOME CARE INSTRUCTIONS Activity  You may resume your regular activity but move at a slower pace for the next 24 hours.   Take frequent rest periods for the next 24 hours.   Walking will help expel (get rid of) the air and reduce the bloated feeling in your abdomen.   No driving for 24 hours (because of the anesthesia (medicine) used during the test).   You may shower.   Do not sign any important legal documents or operate any machinery for 24 hours (because of the anesthesia used during the test).  Nutrition  Drink plenty of fluids.   You may resume your normal diet.   Begin with a light meal and progress to your normal diet.   Avoid alcoholic beverages for 24 hours or as instructed by your caregiver.  Medications You may resume your normal medications unless your caregiver tells you otherwise. What you can expect today  You may experience abdominal discomfort such as a feeling of fullness or "gas" pains.   You may experience a sore throat for 2 to 3 days. This is normal. Gargling with salt water may help this.    SEEK IMMEDIATE MEDICAL CARE IF:  You have excessive nausea (feeling sick to your stomach) and/or vomiting.   You have severe abdominal pain and distention (swelling).   You have trouble swallowing.   You have a temperature over 100 F (37.8 C).   You have rectal bleeding or vomiting of blood.  Document Released:  04/18/2004 Document Revised: 05/17/2011 Document Reviewed: 10/30/2007 [facility] Patient Information 2012 [place].

## 2016-08-03 ENCOUNTER — Encounter (HOSPITAL_COMMUNITY): Payer: Self-pay | Admitting: Gastroenterology

## 2016-08-15 ENCOUNTER — Ambulatory Visit: Payer: 59 | Admitting: Podiatry

## 2016-10-24 DIAGNOSIS — F331 Major depressive disorder, recurrent, moderate: Secondary | ICD-10-CM | POA: Diagnosis not present

## 2016-11-06 DIAGNOSIS — Z0120 Encounter for dental examination and cleaning without abnormal findings: Secondary | ICD-10-CM | POA: Diagnosis not present

## 2016-11-14 ENCOUNTER — Ambulatory Visit (INDEPENDENT_AMBULATORY_CARE_PROVIDER_SITE_OTHER): Payer: 59 | Admitting: Cardiovascular Disease

## 2016-11-14 ENCOUNTER — Encounter: Payer: Self-pay | Admitting: Cardiovascular Disease

## 2016-11-14 ENCOUNTER — Ambulatory Visit (INDEPENDENT_AMBULATORY_CARE_PROVIDER_SITE_OTHER): Payer: 59

## 2016-11-14 VITALS — BP 128/76 | HR 72 | Ht 70.0 in | Wt 200.5 lb

## 2016-11-14 DIAGNOSIS — I059 Rheumatic mitral valve disease, unspecified: Secondary | ICD-10-CM | POA: Diagnosis not present

## 2016-11-14 DIAGNOSIS — R002 Palpitations: Secondary | ICD-10-CM | POA: Diagnosis not present

## 2016-11-14 DIAGNOSIS — R0789 Other chest pain: Secondary | ICD-10-CM | POA: Diagnosis not present

## 2016-11-14 DIAGNOSIS — R42 Dizziness and giddiness: Secondary | ICD-10-CM | POA: Diagnosis not present

## 2016-11-14 MED ORDER — PROPRANOLOL HCL 20 MG PO TABS: 20.0000 mg | ORAL_TABLET | Freq: Three times a day (TID) | ORAL | 1 refills | Status: DC | PRN

## 2016-11-14 NOTE — Progress Notes (Signed)
Cardiology Office Note  Date:  11/14/2016   ID:  Suzanne, Foster [birth year], MRN [id number]  PCP:  No PCP Per Patient   Chief Complaint  Patient presents with  . other    Pt. was seen by Dr. Rockey Situ in 2012. Pt. c/o, dizziness, headache and chest pain, PAC's and PVC's. Meds reviewed by the pt. verbally.     HPI:  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  She reports having frequent episodes of palpitations, with associated dizziness Other times has chest discomfort without the palpitations\ Palpitations have been getting worse recently lasting for hours at a time, very symptomatic Heart starts and stops seems to drop beats She was put on telemetry in the hospital and they noticed arrhythmia, lots of ectopy  Episodes last Sunday, noon until 4 to 5 pm Dropping beats,. Very uncomfortable Yesterday on and off Does not think it is associated with stressors such as work stress, on stress or bad sleep  Reports that she recently Lost 20 pounds intentionally him a portion control, watching her diet  Rare episodes of chest pain presenting at rest, tightness left side Otherwise active at baseline  Previous diagnosis of mitral broke prolapse when she was living in [place], was told she had mild prolapse  Previous echocardiogram 2012, this has been requested for our records as it is not in our system  Review prior notes indicates chest pain back in 2012, was increasing in frequency at that time, atypical features, comes on at rest, 8/10 in intensity. It is a shooting-type sharp pain over the left breast that is deep.  can take her breath away. Symptoms are worse with deep inspiration.   In 2012, She also had palpitations that have been on daily basis. Typically comes on it rest. She's had these for a long time.   She denies excess  caffeine, normal thyroid in October last year. She reports parents have atrial fibrillation.   EKG today shows normal sinus rhythm with rate 72 beats per minute, no significant ST or T wave changes   PMH:   has a past medical history of DDD (degenerative disc disease), lumbar; Depression; IBS (irritable bowel syndrome); Infertility, female; Lactose intolerance; Mitral valve prolapse; and Uterine fibroid.  PSH:    Past Surgical History:  Procedure Laterality Date  . BALLOON DILATION N/A 08/02/2016   Procedure: BALLOON DILATION;  Surgeon: Arta Silence, MD;  Location: [facility];  Service: Endoscopy;  Laterality: N/A;  . CESAREAN SECTION     x1   . ESOPHAGOGASTRODUODENOSCOPY (EGD) WITH PROPOFOL N/A 08/02/2016   Procedure: ESOPHAGOGASTRODUODENOSCOPY (EGD) WITH PROPOFOL;  Surgeon: Arta Silence, MD;  Location: [facility];  Service: Endoscopy;  Laterality: N/A;  . LAPAROSCOPY     x 2 for endometriosis, and ovarian cysts  . WISDOM TOOTH EXTRACTION      Current Outpatient Prescriptions  Medication Sig Dispense Refill  . atenolol (TENORMIN) 25 MG tablet Take 25 mg by mouth every evening.    [name] buPROPion (WELLBUTRIN XL) 150 MG 24 hr tablet Take 450 mg by mouth daily.  0  . FLUoxetine (PROZAC) 40 MG capsule Take 40 mg by mouth every evening.    [name] ibuprofen (ADVIL,MOTRIN) 200 MG tablet Take 600 mg by mouth 2 (two) times daily as needed for headache or moderate pain.    [name] propranolol (INDERAL) 20 MG tablet Take  1 tablet (20 mg total) by mouth 3 (three) times daily as needed. 90 tablet 1   No current facility-administered medications for this visit.      Allergies:   Patient has no known allergies.   Social History:  The patient  reports that she has never smoked. She has never used smokeless tobacco. She reports that she drinks alcohol. She reports that she does not use drugs.   Family History:   family history includes Atrial fibrillation in her father and mother; Breast cancer in her  paternal grandmother; Thyroid cancer in her paternal grandmother; Uterine cancer in her paternal grandmother.    Review of Systems: Review of Systems  Constitutional: Negative.   Respiratory: Negative.   Cardiovascular: Positive for palpitations.  Gastrointestinal: Negative.   Musculoskeletal: Negative.   Neurological: Negative.   Psychiatric/Behavioral: Negative.   All other systems reviewed and are negative.    PHYSICAL EXAM: VS:  BP 128/76 (BP Location: Right Arm, Patient Position: Sitting, Cuff Size: Normal)   Pulse 72   Ht 5' 10"  (1.778 m)   Wt 200 lb 8 oz (90.9 kg)   BMI 28.77 kg/m  , BMI Body mass index is 28.77 kg/m. GEN: Well nourished, well developed, in no acute distress  HEENT: normal  Neck: no JVD, carotid bruits, or masses Cardiac: RRR; no murmurs, rubs, or gallops,no edema  Respiratory:  clear to auscultation bilaterally, normal work of breathing GI: soft, nontender, nondistended, + BS MS: no deformity or atrophy  Skin: warm and dry, no rash Neuro:  Strength and sensation are intact Psych: euthymic mood, full affect    Recent Labs: No results found for requested labs within last 8760 hours.    Lipid Panel No results found for: CHOL, HDL, LDLCALC, TRIG    Wt Readings from Last 3 Encounters:  11/14/16 200 lb 8 oz (90.9 kg)  08/02/16 190 lb (86.2 kg)  06/07/12 190 lb 8 oz (86.4 kg)       ASSESSMENT AND PLAN:  Palpitations - Plan: EKG 12-Lead, LONG TERM MONITOR (3-14 DAYS) Likely having APCs, possibly even PVCs Family history of atrial fibrillation per the patient She is very symptomatic, requesting monitor Monitor has been ordered to determine burden of her ectopy Long discussion concerning management of ectopy including high dose beta blocker, different beta blockers, other medications such as antiarrhythmic medications that could be used. For now we will give her propranolol to take as needed wash she has a monitor on. She will stay on her  atenolol. Recommended she consider taking this in the morning. We did suggest she could take an extra dose for severe symptoms. Additional options include changing to bystolic, stopping atenolol  MITRAL VALVE PROLAPSE No significant murmur appreciated on exam Monitor for now  Atypical chest pain If symptoms get worse, options include CT coronary calcium scoring for risk stratification, routine treadmill study or treadmill stress echo She will call us if symptoms get worse especially with exertion  Dizziness Having some dizziness when she has ectopy,  Monitor ordered as above Recommended she stay hydrated   Total encounter time more than 45 minutes  Greater than 50% was spent in counseling and coordination of care with the patient   Disposition:   F/U as needed, will call her with results the monitor   Orders Placed This Encounter  Procedures  . LONG TERM MONITOR (3-14 DAYS)  . EKG 12-Lead     Signed, Esmond Plants, M.D., Ph.D. 11/14/2016  [place],  [place] [phone]

## 2016-11-14 NOTE — Patient Instructions (Addendum)
Medication Instructions:   Please try 1/2 or whole propranolol as needed for palpitations  Try atenolol in the Am  Consider a change from atenolol to bystolic for daily coverage   Labwork:  No new labs needed  Testing/Procedures:  We will order a zio monitor for palpitations, chest pain, dizziness  Research CT coronary calcium score    I recommend watching educational videos on topics of interest to you at:       [email or web address].com  Enter code: HEARTCARE    Follow-Up: It was a pleasure seeing you in the office today. Please call us if you have new issues that need to be addressed before your next appt.  [phone]  Your physician wants you to follow-up in:  we will call you with the results  If you need a refill on your cardiac medications before your next appointment, please call your pharmacy.     Coronary Calcium Scan A coronary calcium scan is an imaging test used to look for deposits of calcium and other fatty materials (plaques) in the inner lining of the blood vessels of the heart (coronary arteries). These deposits of calcium and plaques can partly clog and narrow the coronary arteries without producing any symptoms or warning signs. This puts a person at risk for a heart attack. This test can detect these deposits before symptoms develop. Tell a health care provider about:  Any allergies you have.  All medicines you are taking, including vitamins, herbs, eye drops, creams, and over-the-counter medicines.  Any problems you or family members have had with anesthetic medicines.  Any blood disorders you have.  Any surgeries you have had.  Any medical conditions you have.  Whether you are pregnant or may be pregnant. What are the risks? Generally, this is a safe procedure. However, problems may occur, including:  Harm to a pregnant woman and her unborn baby. This test involves the use of radiation. Radiation exposure can be dangerous to a pregnant woman and  her unborn baby. If you are pregnant, you generally should not have this procedure done.  Slight increase in the risk of cancer. This is because of the radiation involved in the test. What happens before the procedure? No preparation is needed for this procedure. What happens during the procedure?  You will undress and remove any jewelry around your neck or chest.  You will put on a hospital gown.  Sticky electrodes will be placed on your chest. The electrodes will be connected to an electrocardiogram (ECG) machine to record a tracing of the electrical activity of your heart.  A CT scanner will take pictures of your heart. During this time, you will be asked to lie still and hold your breath for 2-3 seconds while a picture of your heart is being taken. The procedure may vary among health care providers and hospitals. What happens after the procedure?  You can get dressed.  You can return to your normal activities.  It is up to you to get the results of your test. Ask your health care provider, or the department that is doing the test, when your results will be ready. Summary  A coronary calcium scan is an imaging test used to look for deposits of calcium and other fatty materials (plaques) in the inner lining of the blood vessels of the heart (coronary arteries).  Generally, this is a safe procedure. Tell your health care provider if you are pregnant or may be pregnant.  No preparation is needed for this procedure.  A CT scanner will take pictures of your heart.  You can return to your normal activities after the scan is done. This information is not intended to replace advice given to you by your health care provider. Make sure you discuss any questions you have with your health care provider. Document Released: 03/02/2008 Document Revised: 07/24/2016 Document Reviewed: 07/24/2016 Elsevier Interactive Patient Education  2017 [employer].

## 2016-11-26 DIAGNOSIS — R002 Palpitations: Secondary | ICD-10-CM | POA: Diagnosis not present

## 2016-12-06 DIAGNOSIS — R002 Palpitations: Secondary | ICD-10-CM | POA: Diagnosis not present

## 2017-04-10 DIAGNOSIS — F331 Major depressive disorder, recurrent, moderate: Secondary | ICD-10-CM | POA: Diagnosis not present

## 2017-05-24 DIAGNOSIS — F331 Major depressive disorder, recurrent, moderate: Secondary | ICD-10-CM | POA: Diagnosis not present

## 2017-07-20 ENCOUNTER — Other Ambulatory Visit
Admission: RE | Admit: 2017-07-20 | Discharge: 2017-07-20 | Disposition: A | Discharge status: Home / Self Care | Payer: 59 | Source: Ambulatory Visit | Attending: Internal Medicine | Admitting: Internal Medicine

## 2017-07-20 DIAGNOSIS — R5383 Other fatigue: Secondary | ICD-10-CM | POA: Insufficient documentation

## 2017-07-20 LAB — IRON AND TIBC
IRON: 22 ug/dL — AB (ref 28–170)
Saturation Ratios: 5 % — ABNORMAL LOW (ref 10.4–31.8)
TIBC: 433 ug/dL (ref 250–450)
UIBC: 411 ug/dL

## 2017-07-20 LAB — CBC WITH DIFFERENTIAL/PLATELET
BASOS PCT: 1 %
Basophils Absolute: 0 10*3/uL (ref 0–0.1)
EOS ABS: 0.3 10*3/uL (ref 0–0.7)
Eosinophils Relative: 4 %
HCT: 33.8 % — ABNORMAL LOW (ref 35.0–47.0)
HEMOGLOBIN: 10.9 g/dL — AB (ref 12.0–16.0)
Lymphocytes Relative: 26 %
Lymphs Abs: 1.5 10*3/uL (ref 1.0–3.6)
MCH: 23.1 pg — ABNORMAL LOW (ref 26.0–34.0)
MCHC: 32.3 g/dL (ref 32.0–36.0)
MCV: 71.6 fL — ABNORMAL LOW (ref 80.0–100.0)
Monocytes Absolute: 0.4 10*3/uL (ref 0.2–0.9)
Monocytes Relative: 7 %
NEUTROS PCT: 62 %
Neutro Abs: 3.6 10*3/uL (ref 1.4–6.5)
PLATELETS: 245 10*3/uL (ref 150–440)
RBC: 4.72 MIL/uL (ref 3.80–5.20)
RDW: 18.3 % — ABNORMAL HIGH (ref 11.5–14.5)
WBC: 5.8 10*3/uL (ref 3.6–11.0)

## 2017-07-20 LAB — FERRITIN: FERRITIN: 5 ng/mL — AB (ref 11–307)

## 2017-07-30 DIAGNOSIS — Z01419 Encounter for gynecological examination (general) (routine) without abnormal findings: Secondary | ICD-10-CM | POA: Diagnosis not present

## 2017-07-30 DIAGNOSIS — Z1151 Encounter for screening for human papillomavirus (HPV): Secondary | ICD-10-CM | POA: Diagnosis not present

## 2017-07-30 DIAGNOSIS — Z1389 Encounter for screening for other disorder: Secondary | ICD-10-CM | POA: Diagnosis not present

## 2017-07-30 DIAGNOSIS — Z124 Encounter for screening for malignant neoplasm of cervix: Secondary | ICD-10-CM | POA: Diagnosis not present

## 2017-07-31 DIAGNOSIS — Z124 Encounter for screening for malignant neoplasm of cervix: Secondary | ICD-10-CM | POA: Diagnosis not present

## 2017-08-02 ENCOUNTER — Other Ambulatory Visit: Payer: Self-pay | Admitting: Obstetrics and Gynecology

## 2017-08-02 DIAGNOSIS — Z1231 Encounter for screening mammogram for malignant neoplasm of breast: Secondary | ICD-10-CM

## 2017-08-03 ENCOUNTER — Ambulatory Visit
Admission: RE | Admit: 2017-08-03 | Discharge: 2017-08-03 | Disposition: A | Discharge status: Home / Self Care | Payer: 59 | Source: Ambulatory Visit | Attending: Obstetrics and Gynecology | Admitting: Obstetrics and Gynecology

## 2017-08-03 DIAGNOSIS — Z1231 Encounter for screening mammogram for malignant neoplasm of breast: Secondary | ICD-10-CM | POA: Diagnosis not present

## 2017-08-31 ENCOUNTER — Other Ambulatory Visit
Admission: RE | Admit: 2017-08-31 | Discharge: 2017-08-31 | Disposition: A | Discharge status: Home / Self Care | Payer: 59 | Source: Ambulatory Visit | Attending: Internal Medicine | Admitting: Internal Medicine

## 2017-08-31 DIAGNOSIS — D509 Iron deficiency anemia, unspecified: Secondary | ICD-10-CM | POA: Insufficient documentation

## 2017-08-31 LAB — CBC WITH DIFFERENTIAL/PLATELET
Basophils Absolute: 0.1 10*3/uL (ref 0–0.1)
Basophils Relative: 1 %
Eosinophils Absolute: 0.4 10*3/uL (ref 0–0.7)
Eosinophils Relative: 4 %
HCT: 33 % — ABNORMAL LOW (ref 35.0–47.0)
Hemoglobin: 10.9 g/dL — ABNORMAL LOW (ref 12.0–16.0)
Lymphocytes Relative: 23 %
Lymphs Abs: 1.9 10*3/uL (ref 1.0–3.6)
MCH: 24.1 pg — ABNORMAL LOW (ref 26.0–34.0)
MCHC: 32.9 g/dL (ref 32.0–36.0)
MCV: 73.2 fL — ABNORMAL LOW (ref 80.0–100.0)
Monocytes Absolute: 0.6 10*3/uL (ref 0.2–0.9)
Monocytes Relative: 7 %
Neutro Abs: 5.5 10*3/uL (ref 1.4–6.5)
Neutrophils Relative %: 65 %
Platelets: 258 10*3/uL (ref 150–440)
RBC: 4.51 MIL/uL (ref 3.80–5.20)
RDW: 19.4 % — ABNORMAL HIGH (ref 11.5–14.5)
WBC: 8.4 10*3/uL (ref 3.6–11.0)

## 2017-08-31 LAB — IRON AND TIBC
Iron: 19 ug/dL — ABNORMAL LOW (ref 28–170)
Saturation Ratios: 4 % — ABNORMAL LOW (ref 10.4–31.8)
TIBC: 441 ug/dL (ref 250–450)
UIBC: 422 ug/dL

## 2017-09-04 DIAGNOSIS — F331 Major depressive disorder, recurrent, moderate: Secondary | ICD-10-CM | POA: Diagnosis not present

## 2017-09-06 ENCOUNTER — Inpatient Hospital Stay (HOSPITAL_COMMUNITY): Admission: RE | Admit: 2017-09-06 | Payer: 59 | Source: Ambulatory Visit

## 2017-10-08 DIAGNOSIS — D509 Iron deficiency anemia, unspecified: Secondary | ICD-10-CM | POA: Insufficient documentation

## 2017-10-08 NOTE — Progress Notes (Signed)
[facility]  Telephone:(336) [phone] Fax:(336) [phone]  ID: Suzanne Foster OB: 01-Feb-1971  MR#: [id number]  UYQ#:[id number]  Patient Care Team: Patient, No Pcp Per as PCP - General (General Practice) [name], MD as Consulting Physician (Cardiology)  CHIEF COMPLAINT: Iron deficiency anemia due to chronic blood loss.  INTERVAL HISTORY: Patient is a 47 year old female who was noted to have persistent iron deficiency anemia secondary to heavy menses.  She is taking oral iron supplementation, with no improvement in her hemoglobin.  She also complains of increased weakness and fatigue as well as dyspnea on exertion.  She has no neurologic complaints.  She denies any recent fevers or illnesses.  She has a good appetite and denies weight loss.  She denies any chest pain, hemoptysis, or cough.  She has no nausea, vomiting, constipation, or diarrhea.  She has no melena or hematochezia.  She has no urinary complaints.  Patient otherwise feels well and offers no further specific complaints.  REVIEW OF SYSTEMS:   Review of Systems  Constitutional: Positive for malaise/fatigue. Negative for fever and weight loss.  Respiratory: Positive for shortness of breath. Negative for cough and hemoptysis.   Cardiovascular: Negative.  Negative for chest pain and leg swelling.  Gastrointestinal: Negative.  Negative for blood in stool and melena.  Musculoskeletal: Negative.   Skin: Negative.  Negative for rash.  Neurological: Positive for weakness.  Psychiatric/Behavioral: Negative.  The patient is not nervous/anxious.     As per HPI. Otherwise, a complete review of systems is negative.  PAST MEDICAL HISTORY: Past Medical History:  Diagnosis Date  . DDD (degenerative disc disease), lumbar   . Depression   . IBS (irritable bowel syndrome)   . Infertility, female   . Lactose intolerance   . Mitral valve prolapse    dx age 52, had echo done 10 years ago in [facility]  . Uterine fibroid      PAST SURGICAL HISTORY: Past Surgical History:  Procedure Laterality Date  . BALLOON DILATION N/A 08/02/2016   Procedure: BALLOON DILATION;  Surgeon: Arta Silence, MD;  [facility];  Service: Endoscopy;  Laterality: N/A;  . CESAREAN SECTION     x1   . ESOPHAGOGASTRODUODENOSCOPY (EGD) WITH PROPOFOL N/A 08/02/2016   Procedure: ESOPHAGOGASTRODUODENOSCOPY (EGD) WITH PROPOFOL;  Surgeon: Arta Silence, MD;  [facility];  Service: Endoscopy;  Laterality: N/A;  . LAPAROSCOPY     x 2 for endometriosis, and ovarian cysts  . WISDOM TOOTH EXTRACTION      FAMILY HISTORY: Family History  Problem Relation Age of Onset  . Atrial fibrillation Mother   . Atrial fibrillation Father        CHF, post ablative  . Breast cancer Paternal Grandmother   . Uterine cancer Paternal Grandmother   . Thyroid cancer Paternal Grandmother     ADVANCED DIRECTIVES (Y/N):  N  HEALTH MAINTENANCE: Social History   Tobacco Use  . Smoking status: Never Smoker  . Smokeless tobacco: Never Used  Substance Use Topics  . Alcohol use: Yes    Comment: 1 every 6 months  . Drug use: No     Colonoscopy:  PAP:  Bone density:  Lipid panel:  No Known Allergies  Current Outpatient Medications  Medication Sig Dispense Refill  . atenolol (TENORMIN) 25 MG tablet Take 25 mg by mouth every evening.    [name] buPROPion (WELLBUTRIN XL) 150 MG 24 hr tablet Take 600 mg by mouth daily.   0  . escitalopram (LEXAPRO) 20  MG tablet Take 20 mg by mouth daily.    . propranolol (INDERAL) 20 MG tablet Take 1 tablet (20 mg total) by mouth 3 (three) times daily as needed. 90 tablet 1  . ibuprofen (ADVIL,MOTRIN) 200 MG tablet Take 600 mg by mouth 2 (two) times daily as needed for headache or moderate pain.    . traZODone (DESYREL) 50 MG tablet trazodone 50 mg tablet     No current facility-administered medications for this visit.     OBJECTIVE: Vitals:   10/09/17 1355  BP: 126/79  Pulse: 85  Resp: 18    Temp: 98.8 F (37.1 C)     Body mass index is 31.25 kg/m.    ECOG FS:1 - Symptomatic but completely ambulatory  General: Well-developed, well-nourished, no acute distress. Eyes: Pink conjunctiva, anicteric sclera. HEENT: Normocephalic, moist mucous membranes, clear oropharnyx. Lungs: Clear to auscultation bilaterally. Heart: Regular rate and rhythm. No rubs, murmurs, or gallops. Abdomen: Soft, nontender, nondistended. No organomegaly noted, normoactive bowel sounds. Musculoskeletal: No edema, cyanosis, or clubbing. Neuro: Alert, answering all questions appropriately. Cranial nerves grossly intact. Skin: No rashes or petechiae noted. Psych: Normal affect. Lymphatics: No cervical, calvicular, axillary or inguinal LAD.   LAB RESULTS:  Lab Results  Component Value Date   NA 138 06/07/2012   K 4.1 06/07/2012   CL 104 06/07/2012   CO2 25 06/07/2012   GLUCOSE 114 (H) 06/07/2012   BUN 13 06/07/2012   CREATININE 0.9 06/07/2012   CALCIUM 9.2 06/07/2012   PROT 7.2 06/07/2012   ALBUMIN 4.0 06/07/2012   AST 20 06/07/2012   ALT 13 06/07/2012   ALKPHOS 44 06/07/2012   BILITOT 0.5 06/07/2012    Lab Results  Component Value Date   WBC 8.4 08/31/2017   NEUTROABS 5.5 08/31/2017   HGB 10.9 (L) 08/31/2017   HCT 33.0 (L) 08/31/2017   MCV 73.2 (L) 08/31/2017   PLT 258 08/31/2017   Lab Results  Component Value Date   IRON 19 (L) 08/31/2017   TIBC 441 08/31/2017   IRONPCTSAT 4 (L) 08/31/2017   Lab Results  Component Value Date   FERRITIN 5 (L) 07/20/2017     STUDIES: No results found.  ASSESSMENT: Iron deficiency anemia due to chronic blood loss.  PLAN:    1. Iron deficiency anemia due to chronic blood loss: Secondary to patient's heavy menses.  Despite oral iron supplementation, patient continues to be symptomatic and has no appreciable change in her iron stores or hemoglobin.  Proceed with 510 mg IV Feraheme today.  Patient will return to clinic in 1 week for a second  infusion.  She will then return to clinic in 2 months with repeat laboratory work and further evaluation.  Patient does not require bone marrow biopsy.  If her iron stores are improved, but she remains anemic will consider a full workup at that time. 2.  Heavy menses: Continue evaluation and treatment per gynecology.  Approximately 45 minutes was spent in discussion of which greater than 50% was consultation.  Patient expressed understanding and was in agreement with this plan. She also understands that She can call clinic at any time with any questions, concerns, or complaints.    Lloyd Huger, MD   10/09/2017 2:19 PM

## 2017-10-09 ENCOUNTER — Inpatient Hospital Stay: Payer: 59 | Attending: Oncology | Admitting: Oncology

## 2017-10-09 ENCOUNTER — Encounter: Payer: Self-pay | Admitting: Oncology

## 2017-10-09 VITALS — BP 126/79 | HR 85 | Temp 98.8°F | Resp 18 | Wt 217.8 lb

## 2017-10-09 DIAGNOSIS — R0609 Other forms of dyspnea: Secondary | ICD-10-CM | POA: Insufficient documentation

## 2017-10-09 DIAGNOSIS — D500 Iron deficiency anemia secondary to blood loss (chronic): Secondary | ICD-10-CM | POA: Diagnosis not present

## 2017-10-09 DIAGNOSIS — R5383 Other fatigue: Secondary | ICD-10-CM | POA: Diagnosis not present

## 2017-10-09 DIAGNOSIS — N920 Excessive and frequent menstruation with regular cycle: Secondary | ICD-10-CM | POA: Diagnosis not present

## 2017-10-09 DIAGNOSIS — Z808 Family history of malignant neoplasm of other organs or systems: Secondary | ICD-10-CM | POA: Diagnosis not present

## 2017-10-09 DIAGNOSIS — R5381 Other malaise: Secondary | ICD-10-CM | POA: Diagnosis not present

## 2017-10-09 DIAGNOSIS — Z79899 Other long term (current) drug therapy: Secondary | ICD-10-CM | POA: Diagnosis not present

## 2017-10-09 DIAGNOSIS — R531 Weakness: Secondary | ICD-10-CM | POA: Insufficient documentation

## 2017-10-09 DIAGNOSIS — K589 Irritable bowel syndrome without diarrhea: Secondary | ICD-10-CM | POA: Insufficient documentation

## 2017-10-12 ENCOUNTER — Inpatient Hospital Stay: Payer: 59

## 2017-10-12 VITALS — BP 133/77 | HR 67 | Temp 97.2°F | Resp 18

## 2017-10-12 DIAGNOSIS — N920 Excessive and frequent menstruation with regular cycle: Secondary | ICD-10-CM | POA: Diagnosis not present

## 2017-10-12 DIAGNOSIS — R0609 Other forms of dyspnea: Secondary | ICD-10-CM | POA: Diagnosis not present

## 2017-10-12 DIAGNOSIS — Z808 Family history of malignant neoplasm of other organs or systems: Secondary | ICD-10-CM | POA: Diagnosis not present

## 2017-10-12 DIAGNOSIS — D500 Iron deficiency anemia secondary to blood loss (chronic): Secondary | ICD-10-CM

## 2017-10-12 DIAGNOSIS — R5381 Other malaise: Secondary | ICD-10-CM | POA: Diagnosis not present

## 2017-10-12 DIAGNOSIS — R5383 Other fatigue: Secondary | ICD-10-CM | POA: Diagnosis not present

## 2017-10-12 DIAGNOSIS — R531 Weakness: Secondary | ICD-10-CM | POA: Diagnosis not present

## 2017-10-12 DIAGNOSIS — Z79899 Other long term (current) drug therapy: Secondary | ICD-10-CM | POA: Diagnosis not present

## 2017-10-12 DIAGNOSIS — K589 Irritable bowel syndrome without diarrhea: Secondary | ICD-10-CM | POA: Diagnosis not present

## 2017-10-12 MED ORDER — SODIUM CHLORIDE 0.9 % IV SOLN
510.0000 mg | Freq: Once | INTRAVENOUS | Status: AC
  Administered 2017-10-12: 510 mg via INTRAVENOUS
  Filled 2017-10-12: qty 17

## 2017-10-12 MED ORDER — SODIUM CHLORIDE 0.9 % IV SOLN
Freq: Once | INTRAVENOUS | Status: AC
  Administered 2017-10-12: 14:00:00 via INTRAVENOUS
  Filled 2017-10-12: qty 1000

## 2017-10-24 ENCOUNTER — Encounter: Payer: Self-pay | Admitting: Cardiovascular Disease

## 2017-10-25 ENCOUNTER — Inpatient Hospital Stay: Payer: 59 | Attending: Oncology

## 2017-10-25 VITALS — BP 117/77 | HR 82 | Temp 97.8°F | Resp 20

## 2017-10-25 DIAGNOSIS — D500 Iron deficiency anemia secondary to blood loss (chronic): Secondary | ICD-10-CM | POA: Insufficient documentation

## 2017-10-25 DIAGNOSIS — Z920 Personal history of contraception: Secondary | ICD-10-CM | POA: Insufficient documentation

## 2017-10-25 MED ORDER — SODIUM CHLORIDE 0.9 % IV SOLN
Freq: Once | INTRAVENOUS | Status: AC
  Administered 2017-10-25: 14:00:00 via INTRAVENOUS
  Filled 2017-10-25: qty 1000

## 2017-10-25 MED ORDER — SODIUM CHLORIDE 0.9 % IV SOLN
510.0000 mg | Freq: Once | INTRAVENOUS | Status: AC
  Administered 2017-10-25: 510 mg via INTRAVENOUS
  Filled 2017-10-25: qty 17

## 2017-11-27 ENCOUNTER — Other Ambulatory Visit: Payer: Self-pay

## 2017-11-27 DIAGNOSIS — F331 Major depressive disorder, recurrent, moderate: Secondary | ICD-10-CM | POA: Diagnosis not present

## 2017-12-02 NOTE — Progress Notes (Signed)
[place]  Telephone:(336) [phone] Fax:(336) [phone]  ID: Suzanne Foster OB: 1970-11-08  MR#: [id number]  ASN#:[id number]  Patient Care Team: Patient, No Pcp Per as PCP - General (General Practice) Suzanne Merritts, MD as Consulting Physician (Cardiology)  CHIEF COMPLAINT: Iron deficiency anemia due to chronic blood loss.  INTERVAL HISTORY: Patient returns to clinic today for repeat laboratory work and further evaluation.  She feels significantly improved with more energy since receiving IV Feraheme 3 months ago.  She does not complain of weakness or fatigue today.  She has no neurologic complaints.  She denies any recent fevers or illnesses.  She has a good appetite and denies weight loss.  She denies any chest pain, shortness of breath, hemoptysis, or cough.  She has no nausea, vomiting, constipation, or diarrhea.  She has no melena or hematochezia.  She has no urinary complaints.  Patient offers no specific complaints today.  REVIEW OF SYSTEMS:   Review of Systems  Constitutional: Negative.  Negative for fever, malaise/fatigue and weight loss.  Respiratory: Negative.  Negative for cough, hemoptysis and shortness of breath.   Cardiovascular: Negative.  Negative for chest pain and leg swelling.  Gastrointestinal: Negative.  Negative for blood in stool and melena.  Musculoskeletal: Negative.   Skin: Negative.  Negative for rash.  Neurological: Negative.  Negative for focal weakness and weakness.  Psychiatric/Behavioral: Negative.  The patient is not nervous/anxious.     As per HPI. Otherwise, a complete review of systems is negative.  PAST MEDICAL HISTORY: Past Medical History:  Diagnosis Date  . DDD (degenerative disc disease), lumbar   . Depression   . IBS (irritable bowel syndrome)   . Infertility, female   . Lactose intolerance   . Mitral valve prolapse    dx age 67, had echo done 10 years ago in [facility]  . Uterine fibroid     PAST SURGICAL  HISTORY: Past Surgical History:  Procedure Laterality Date  . BALLOON DILATION N/A 08/02/2016   Procedure: BALLOON DILATION;  Surgeon: Arta Silence, MD;  [facility];  Service: Endoscopy;  Laterality: N/A;  . CESAREAN SECTION     x1   . ESOPHAGOGASTRODUODENOSCOPY (EGD) WITH PROPOFOL N/A 08/02/2016   Procedure: ESOPHAGOGASTRODUODENOSCOPY (EGD) WITH PROPOFOL;  Surgeon: Arta Silence, MD;  [facility];  Service: Endoscopy;  Laterality: N/A;  . LAPAROSCOPY     x 2 for endometriosis, and ovarian cysts  . WISDOM TOOTH EXTRACTION      FAMILY HISTORY: Family History  Problem Relation Age of Onset  . Atrial fibrillation Mother   . Atrial fibrillation Father        CHF, post ablative  . Breast cancer Paternal Grandmother   . Uterine cancer Paternal Grandmother   . Thyroid cancer Paternal Grandmother     ADVANCED DIRECTIVES (Y/N):  N  HEALTH MAINTENANCE: Social History   Tobacco Use  . Smoking status: Never Smoker  . Smokeless tobacco: Never Used  Substance Use Topics  . Alcohol use: Yes    Comment: 1 every 6 months  . Drug use: No     Colonoscopy:  PAP:  Bone density:  Lipid panel:  No Known Allergies  Current Outpatient Medications  Medication Sig Dispense Refill  . atenolol (TENORMIN) 25 MG tablet Take 25 mg by mouth every evening.    [name] buPROPion (WELLBUTRIN XL) 150 MG 24 hr tablet Take 600 mg by mouth daily.   0  . escitalopram (LEXAPRO) 20 MG tablet Take 20 mg  by mouth daily.    [name] ibuprofen (ADVIL,MOTRIN) 200 MG tablet Take 600 mg by mouth 2 (two) times daily as needed for headache or moderate pain.    [name] propranolol (INDERAL) 20 MG tablet Take 1 tablet (20 mg total) by mouth 3 (three) times daily as needed. (Patient not taking: Reported on 12/05/2017) 90 tablet 1  . traZODone (DESYREL) 50 MG tablet trazodone 50 mg tablet     No current facility-administered medications for this visit.     OBJECTIVE: Vitals:   12/05/17 1426  BP: (!) 141/78   Pulse: 79  Resp: 18  Temp: 97.8 F (36.6 C)     Body mass index is 31.94 kg/m.    ECOG FS:0 - Asymptomatic  General: Well-developed, well-nourished, no acute distress. Eyes: Pink conjunctiva, anicteric sclera. Lungs: Clear to auscultation bilaterally. Heart: Regular rate and rhythm. No rubs, murmurs, or gallops. Abdomen: Soft, nontender, nondistended. No organomegaly noted, normoactive bowel sounds. Musculoskeletal: No edema, cyanosis, or clubbing. Neuro: Alert, answering all questions appropriately. Cranial nerves grossly intact. Skin: No rashes or petechiae noted. Psych: Normal affect.   LAB RESULTS:  Lab Results  Component Value Date   NA 138 06/07/2012   K 4.1 06/07/2012   CL 104 06/07/2012   CO2 25 06/07/2012   GLUCOSE 114 (H) 06/07/2012   BUN 13 06/07/2012   CREATININE 0.9 06/07/2012   CALCIUM 9.2 06/07/2012   PROT 7.2 06/07/2012   ALBUMIN 4.0 06/07/2012   AST 20 06/07/2012   ALT 13 06/07/2012   ALKPHOS 44 06/07/2012   BILITOT 0.5 06/07/2012    Lab Results  Component Value Date   WBC 6.4 12/05/2017   NEUTROABS 4.1 12/05/2017   HGB 12.7 12/05/2017   HCT 38.0 12/05/2017   MCV 83.6 12/05/2017   PLT 265 12/05/2017   Lab Results  Component Value Date   IRON 96 12/05/2017   TIBC 314 12/05/2017   IRONPCTSAT 31 12/05/2017   Lab Results  Component Value Date   FERRITIN 60 12/05/2017     STUDIES: No results found.  ASSESSMENT: Iron deficiency anemia due to chronic blood loss.  PLAN:    1. Iron deficiency anemia due to chronic blood loss: Secondary to patient's heavy menses.  Patient's hemoglobin and iron stores are now within normal limits.  No further intervention is needed at this time.  Patient does not require additional IV Feraheme.  Her last infusion occurred on October 25, 2017.  Return to clinic in 3 months with repeat laboratory work and further evaluation.   2.  Heavy menses: Continue evaluation and treatment per gynecology.  Approximately  20 minutes was spent in discussion of which greater than 50% was consultation.  Patient expressed understanding and was in agreement with this plan. She also understands that She can call clinic at any time with any questions, concerns, or complaints.    Lloyd Huger, MD   12/07/2017 3:58 PM

## 2017-12-05 ENCOUNTER — Inpatient Hospital Stay: Payer: 59

## 2017-12-05 ENCOUNTER — Inpatient Hospital Stay (HOSPITAL_BASED_OUTPATIENT_CLINIC_OR_DEPARTMENT_OTHER): Payer: 59 | Admitting: Oncology

## 2017-12-05 ENCOUNTER — Inpatient Hospital Stay: Payer: 59 | Attending: Oncology

## 2017-12-05 ENCOUNTER — Other Ambulatory Visit: Payer: Self-pay

## 2017-12-05 VITALS — BP 141/78 | HR 79 | Temp 97.8°F | Resp 18 | Wt 222.6 lb

## 2017-12-05 DIAGNOSIS — Z803 Family history of malignant neoplasm of breast: Secondary | ICD-10-CM | POA: Insufficient documentation

## 2017-12-05 DIAGNOSIS — I341 Nonrheumatic mitral (valve) prolapse: Secondary | ICD-10-CM | POA: Diagnosis not present

## 2017-12-05 DIAGNOSIS — E739 Lactose intolerance, unspecified: Secondary | ICD-10-CM | POA: Diagnosis not present

## 2017-12-05 DIAGNOSIS — N920 Excessive and frequent menstruation with regular cycle: Secondary | ICD-10-CM

## 2017-12-05 DIAGNOSIS — Z79899 Other long term (current) drug therapy: Secondary | ICD-10-CM

## 2017-12-05 DIAGNOSIS — M5136 Other intervertebral disc degeneration, lumbar region: Secondary | ICD-10-CM | POA: Insufficient documentation

## 2017-12-05 DIAGNOSIS — Z808 Family history of malignant neoplasm of other organs or systems: Secondary | ICD-10-CM | POA: Insufficient documentation

## 2017-12-05 DIAGNOSIS — D500 Iron deficiency anemia secondary to blood loss (chronic): Secondary | ICD-10-CM | POA: Diagnosis not present

## 2017-12-05 DIAGNOSIS — K589 Irritable bowel syndrome without diarrhea: Secondary | ICD-10-CM | POA: Insufficient documentation

## 2017-12-05 LAB — CBC WITH DIFFERENTIAL/PLATELET
Basophils Absolute: 0 10*3/uL (ref 0–0.1)
Basophils Relative: 1 %
EOS ABS: 0.2 10*3/uL (ref 0–0.7)
EOS PCT: 3 %
HCT: 38 % (ref 35.0–47.0)
HEMOGLOBIN: 12.7 g/dL (ref 12.0–16.0)
LYMPHS ABS: 1.6 10*3/uL (ref 1.0–3.6)
LYMPHS PCT: 26 %
MCH: 28 pg (ref 26.0–34.0)
MCHC: 33.5 g/dL (ref 32.0–36.0)
MCV: 83.6 fL (ref 80.0–100.0)
Monocytes Absolute: 0.4 10*3/uL (ref 0.2–0.9)
Monocytes Relative: 7 %
NEUTROS PCT: 63 %
Neutro Abs: 4.1 10*3/uL (ref 1.4–6.5)
PLATELETS: 265 10*3/uL (ref 150–440)
RBC: 4.55 MIL/uL (ref 3.80–5.20)
RDW: 20.1 % — ABNORMAL HIGH (ref 11.5–14.5)
WBC: 6.4 10*3/uL (ref 3.6–11.0)

## 2017-12-05 LAB — IRON AND TIBC
Iron: 96 ug/dL (ref 28–170)
Saturation Ratios: 31 % (ref 10.4–31.8)
TIBC: 314 ug/dL (ref 250–450)
UIBC: 218 ug/dL

## 2017-12-05 LAB — FERRITIN: FERRITIN: 60 ng/mL (ref 11–307)

## 2017-12-05 NOTE — Progress Notes (Signed)
Here for follow up. Stated she is feeling good w more energy,and overall doing well.

## 2018-03-04 DIAGNOSIS — H52223 Regular astigmatism, bilateral: Secondary | ICD-10-CM | POA: Diagnosis not present

## 2018-03-04 DIAGNOSIS — H5203 Hypermetropia, bilateral: Secondary | ICD-10-CM | POA: Diagnosis not present

## 2018-03-04 DIAGNOSIS — D3132 Benign neoplasm of left choroid: Secondary | ICD-10-CM | POA: Diagnosis not present

## 2018-03-18 DIAGNOSIS — N6321 Unspecified lump in the left breast, upper outer quadrant: Secondary | ICD-10-CM | POA: Diagnosis not present

## 2018-03-24 NOTE — Progress Notes (Unsigned)
Suzanne Foster  Telephone:(336) [phone] Fax:(336) [phone]  ID: Suzanne Foster OB: 1971/08/08  MR#: [id number]  GMW#:[id number]  Patient Care Team: Patient, No Pcp Per as PCP - General (General Practice) [name], MD as Consulting Physician (Cardiology)  CHIEF COMPLAINT: Iron deficiency anemia due to chronic blood loss.  INTERVAL HISTORY: Patient returns to clinic today for repeat laboratory work and further evaluation.  She feels significantly improved with more energy since receiving IV Feraheme 3 months ago.  She does not complain of weakness or fatigue today.  She has no neurologic complaints.  She denies any recent fevers or illnesses.  She has a good appetite and denies weight loss.  She denies any chest pain, shortness of breath, hemoptysis, or cough.  She has no nausea, vomiting, constipation, or diarrhea.  She has no melena or hematochezia.  She has no urinary complaints.  Patient offers no specific complaints today.  REVIEW OF SYSTEMS:   Review of Systems  Constitutional: Negative.  Negative for fever, malaise/fatigue and weight loss.  Respiratory: Negative.  Negative for cough, hemoptysis and shortness of breath.   Cardiovascular: Negative.  Negative for chest pain and leg swelling.  Gastrointestinal: Negative.  Negative for blood in stool and melena.  Musculoskeletal: Negative.   Skin: Negative.  Negative for rash.  Neurological: Negative.  Negative for focal weakness and weakness.  Psychiatric/Behavioral: Negative.  The patient is not nervous/anxious.     As per HPI. Otherwise, a complete review of systems is negative.  PAST MEDICAL HISTORY: Past Medical History:  Diagnosis Date  . DDD (degenerative disc disease), lumbar   . Depression   . IBS (irritable bowel syndrome)   . Infertility, female   . Lactose intolerance   . Mitral valve prolapse    dx age 89, had echo done 10 years ago in [facility]  . Uterine fibroid     PAST SURGICAL  HISTORY: Past Surgical History:  Procedure Laterality Date  . BALLOON DILATION N/A 08/02/2016   Procedure: BALLOON DILATION;  Surgeon: Arta Silence, MD;  Location: [facility];  Service: Endoscopy;  Laterality: N/A;  . CESAREAN SECTION     x1   . ESOPHAGOGASTRODUODENOSCOPY (EGD) WITH PROPOFOL N/A 08/02/2016   Procedure: ESOPHAGOGASTRODUODENOSCOPY (EGD) WITH PROPOFOL;  Surgeon: Arta Silence, MD;  Location: [facility];  Service: Endoscopy;  Laterality: N/A;  . LAPAROSCOPY     x 2 for endometriosis, and ovarian cysts  . WISDOM TOOTH EXTRACTION      FAMILY HISTORY: Family History  Problem Relation Age of Onset  . Atrial fibrillation Mother   . Atrial fibrillation Father        CHF, post ablative  . Breast cancer Paternal Grandmother   . Uterine cancer Paternal Grandmother   . Thyroid cancer Paternal Grandmother     ADVANCED DIRECTIVES (Y/N):  N  HEALTH MAINTENANCE: Social History   Tobacco Use  . Smoking status: Never Smoker  . Smokeless tobacco: Never Used  Substance Use Topics  . Alcohol use: Yes    Comment: 1 every 6 months  . Drug use: No     Colonoscopy:  PAP:  Bone density:  Lipid panel:  No Known Allergies  Current Outpatient Medications  Medication Sig Dispense Refill  . atenolol (TENORMIN) 25 MG tablet Take 25 mg by mouth every evening.    [name] buPROPion (WELLBUTRIN XL) 150 MG 24 hr tablet Take 600 mg by mouth daily.   0  . escitalopram (LEXAPRO) 20 MG tablet Take 20 mg  by mouth daily.    [name] ibuprofen (ADVIL,MOTRIN) 200 MG tablet Take 600 mg by mouth 2 (two) times daily as needed for headache or moderate pain.    [name] propranolol (INDERAL) 20 MG tablet Take 1 tablet (20 mg total) by mouth 3 (three) times daily as needed. (Patient not taking: Reported on 12/05/2017) 90 tablet 1  . traZODone (DESYREL) 50 MG tablet trazodone 50 mg tablet     No current facility-administered medications for this visit.     OBJECTIVE: There were no vitals filed for this  visit.   There is no height or weight on file to calculate BMI.    ECOG FS:0 - Asymptomatic  General: Well-developed, well-nourished, no acute distress. Eyes: Pink conjunctiva, anicteric sclera. Lungs: Clear to auscultation bilaterally. Heart: Regular rate and rhythm. No rubs, murmurs, or gallops. Abdomen: Soft, nontender, nondistended. No organomegaly noted, normoactive bowel sounds. Musculoskeletal: No edema, cyanosis, or clubbing. Neuro: Alert, answering all questions appropriately. Cranial nerves grossly intact. Skin: No rashes or petechiae noted. Psych: Normal affect.   LAB RESULTS:  Lab Results  Component Value Date   NA 138 06/07/2012   K 4.1 06/07/2012   CL 104 06/07/2012   CO2 25 06/07/2012   GLUCOSE 114 (H) 06/07/2012   BUN 13 06/07/2012   CREATININE 0.9 06/07/2012   CALCIUM 9.2 06/07/2012   PROT 7.2 06/07/2012   ALBUMIN 4.0 06/07/2012   AST 20 06/07/2012   ALT 13 06/07/2012   ALKPHOS 44 06/07/2012   BILITOT 0.5 06/07/2012    Lab Results  Component Value Date   WBC 6.4 12/05/2017   NEUTROABS 4.1 12/05/2017   HGB 12.7 12/05/2017   HCT 38.0 12/05/2017   MCV 83.6 12/05/2017   PLT 265 12/05/2017   Lab Results  Component Value Date   IRON 96 12/05/2017   TIBC 314 12/05/2017   IRONPCTSAT 31 12/05/2017   Lab Results  Component Value Date   FERRITIN 60 12/05/2017     STUDIES: No results found.  ASSESSMENT: Iron deficiency anemia due to chronic blood loss.  PLAN:    1. Iron deficiency anemia due to chronic blood loss: Secondary to patient's heavy menses.  Patient's hemoglobin and iron stores are now within normal limits.  No further intervention is needed at this time.  Patient does not require additional IV Feraheme.  Her last infusion occurred on October 25, 2017.  Return to clinic in 3 months with repeat laboratory work and further evaluation.   2.  Heavy menses: Continue evaluation and treatment per gynecology.  Approximately 20 minutes was spent  in discussion of which greater than 50% was consultation.  Patient expressed understanding and was in agreement with this plan. She also understands that She can call clinic at any time with any questions, concerns, or complaints.    Lloyd Huger, MD   03/24/2018 7:44 AM

## 2018-03-25 ENCOUNTER — Inpatient Hospital Stay: Payer: 59

## 2018-03-25 ENCOUNTER — Inpatient Hospital Stay: Payer: 59 | Attending: Oncology

## 2018-03-25 ENCOUNTER — Other Ambulatory Visit: Payer: Self-pay | Admitting: *Deleted

## 2018-03-25 ENCOUNTER — Inpatient Hospital Stay: Payer: 59 | Admitting: Oncology

## 2018-03-25 DIAGNOSIS — D500 Iron deficiency anemia secondary to blood loss (chronic): Secondary | ICD-10-CM | POA: Diagnosis not present

## 2018-03-25 DIAGNOSIS — N920 Excessive and frequent menstruation with regular cycle: Secondary | ICD-10-CM | POA: Diagnosis not present

## 2018-03-25 DIAGNOSIS — D649 Anemia, unspecified: Secondary | ICD-10-CM

## 2018-03-25 LAB — CBC WITH DIFFERENTIAL/PLATELET
BASOS ABS: 0 10*3/uL (ref 0–0.1)
Basophils Relative: 1 %
EOS ABS: 0.2 10*3/uL (ref 0–0.7)
Eosinophils Relative: 2 %
HEMATOCRIT: 39.2 % (ref 35.0–47.0)
HEMOGLOBIN: 13.4 g/dL (ref 12.0–16.0)
Lymphocytes Relative: 26 %
Lymphs Abs: 2 10*3/uL (ref 1.0–3.6)
MCH: 29.7 pg (ref 26.0–34.0)
MCHC: 34.1 g/dL (ref 32.0–36.0)
MCV: 87.1 fL (ref 80.0–100.0)
MONOS PCT: 7 %
Monocytes Absolute: 0.5 10*3/uL (ref 0.2–0.9)
NEUTROS ABS: 4.8 10*3/uL (ref 1.4–6.5)
NEUTROS PCT: 64 %
Platelets: 282 10*3/uL (ref 150–440)
RBC: 4.5 MIL/uL (ref 3.80–5.20)
RDW: 14.1 % (ref 11.5–14.5)
WBC: 7.5 10*3/uL (ref 3.6–11.0)

## 2018-03-25 LAB — IRON AND TIBC
Iron: 58 ug/dL (ref 28–170)
Saturation Ratios: 15 % (ref 10.4–31.8)
TIBC: 391 ug/dL (ref 250–450)
UIBC: 333 ug/dL

## 2018-03-25 LAB — FERRITIN: Ferritin: 14 ng/mL (ref 11–307)

## 2018-03-26 ENCOUNTER — Other Ambulatory Visit: Payer: Self-pay | Admitting: Obstetrics and Gynecology

## 2018-03-26 DIAGNOSIS — N6321 Unspecified lump in the left breast, upper outer quadrant: Secondary | ICD-10-CM

## 2018-03-29 ENCOUNTER — Telehealth: Payer: Self-pay | Admitting: *Deleted

## 2018-03-29 NOTE — Telephone Encounter (Signed)
Patient called for results. She was unable to make her appt this week. Do you want her to come in sooner than [place] based on this weeks results? She reports that she is feeling more tired.

## 2018-03-29 NOTE — Telephone Encounter (Signed)
Cbc and iron stores are WNL.  I am comfortable with the appt in 3 months based on these results.  Thanks!

## 2018-03-29 NOTE — Telephone Encounter (Signed)
Called patient with Dr. Gary Fleet  Recommendations left message on phone.

## 2018-05-28 DIAGNOSIS — F331 Major depressive disorder, recurrent, moderate: Secondary | ICD-10-CM

## 2018-06-06 ENCOUNTER — Other Ambulatory Visit: Payer: Self-pay

## 2018-06-06 ENCOUNTER — Telehealth: Payer: Self-pay | Admitting: Cardiovascular Disease

## 2018-06-06 MED ORDER — ATENOLOL 25 MG PO TABS: 25.0000 mg | ORAL_TABLET | Freq: Every evening | ORAL | 0 refills | Status: DC

## 2018-06-06 NOTE — Telephone Encounter (Signed)
°*  STAT* If patient is at the pharmacy, call can be transferred to refill team.   1. Which medications need to be refilled? (please list name of each medication and dose if known) atenolol 25 mg po q day  2. Which pharmacy/location (including street and city if local pharmacy) is medication to be sent to? [facility]   3. Do they need a 30 day or 90 day supply? [place]

## 2018-06-18 ENCOUNTER — Encounter: Payer: Self-pay | Admitting: Physician Assistant

## 2018-06-18 ENCOUNTER — Ambulatory Visit (INDEPENDENT_AMBULATORY_CARE_PROVIDER_SITE_OTHER): Payer: 59 | Admitting: Physician Assistant

## 2018-06-18 DIAGNOSIS — G4700 Insomnia, unspecified: Secondary | ICD-10-CM

## 2018-06-18 DIAGNOSIS — F411 Generalized anxiety disorder: Secondary | ICD-10-CM | POA: Diagnosis not present

## 2018-06-18 DIAGNOSIS — F331 Major depressive disorder, recurrent, moderate: Secondary | ICD-10-CM

## 2018-06-18 MED ORDER — TRAZODONE HCL 50 MG PO TABS: 50.0000 mg | ORAL_TABLET | Freq: Every evening | ORAL | 3 refills | Status: DC | PRN

## 2018-06-18 MED ORDER — LORAZEPAM 0.5 MG PO TABS: 0.5000 mg | ORAL_TABLET | Freq: Three times a day (TID) | ORAL | 1 refills | Status: DC | PRN

## 2018-06-18 MED ORDER — BUPROPION HCL ER (XL) 150 MG PO TB24: 150.0000 mg | ORAL_TABLET | Freq: Three times a day (TID) | ORAL | 3 refills | Status: DC

## 2018-06-18 MED ORDER — ESCITALOPRAM OXALATE 20 MG PO TABS: 20.0000 mg | ORAL_TABLET | Freq: Every day | ORAL | 3 refills | Status: DC

## 2018-06-18 NOTE — Patient Instructions (Signed)
Sleep hygeine

## 2018-06-18 NOTE — Progress Notes (Signed)
[facility]  Patient ID: Suzanne Suzanne Foster,  MRN: [id number]  PCP: Patient, No Pcp Per  Date of Evaluation: 06/18/2018 Time spent:20 minutes   HISTORY/CURRENT STATUS: Suzanne Foster is here for her six-month follow-up.  She needs her medications refilled.  Other than anxiety related to work she is doing well.  She is a Education officer, museum at [employer] and has had an increased workload in Suzanne past few months.  Prefers not to take Ativan anymore than absolutely has to."  I need to find a life outside of work and being a mother."  States she has what she feels are normal concerns concerning her 46 year old son, whether he will be taking care of well if something happens to her etc.  "I do not obsess about it but it is something I do think about."  States she is sleeping better with Suzanne trazodone.  She only takes 50 mg which is effective.  She was hesitant to take that for quite a while but now has to 5 out of 7 days a week.  Denies anhedonia.  Energy and motivation are good.  No easy crying.   Individual Medical History/ Review of Systems: Changes? :No  Allergies: Patient has no known allergies.  Current Medications:  Current Outpatient Medications:  .  atenolol (TENORMIN) 25 MG tablet, Take 1 tablet (25 mg total) by mouth every evening. Pt needs an appointment for further refills, Disp: 30 tablet, Rfl: 0 .  buPROPion (WELLBUTRIN XL) 150 MG 24 hr tablet, Take 1 tablet (150 mg total) by mouth 3 (three) times daily., Disp: 270 tablet, Rfl: 3 .  escitalopram (LEXAPRO) 20 MG tablet, Take 1 tablet (20 mg total) by mouth daily., Disp: 90 tablet, Rfl: 3 .  ibuprofen (ADVIL,MOTRIN) 200 MG tablet, Take 600 mg by mouth 2 (two) times daily as needed for headache or moderate pain., Disp: , Rfl:  .  LORazepam (ATIVAN) 0.5 MG tablet, Take 1 tablet (0.5 mg total) by mouth every 8 (eight) hours as needed for anxiety., Disp: 30 tablet, Rfl: 1 .  propranolol (INDERAL) 20 MG tablet, Take 1 tablet  (20 mg total) by mouth 3 (three) times daily as needed. (Patient not taking: Reported on 12/05/2017), Disp: 90 tablet, Rfl: 1 .  traZODone (DESYREL) 50 MG tablet, Take 1 tablet (50 mg total) by mouth at bedtime as needed for sleep. CAN TAKE 1-2 QHS PRN, Disp: 180 tablet, Rfl: 3 Medication Side Effects: None  Family Medical/ Social History: Changes? No  MENTAL HEALTH EXAM:  There were no vitals taken for this visit.There is no height or weight on file to calculate BMI.  General Appearance: Well Groomed  Eye Contact:  Good  Speech:  Clear and Coherent  Volume:  Normal  Mood:  Negative  Affect:  Appropriate  Thought Process:  Coherent  Orientation:  Full (Time, Place, and Person)  Thought Content: Logical   Suicidal Thoughts:  No  Homicidal Thoughts:  No  Memory:  Immediate  Judgement:  Good  Insight:  Good  Psychomotor Activity:  Normal  Concentration:  Concentration: Good  Recall:  Good  Fund of Knowledge: Good  Language: Good  Akathisia:  NA  AIMS (if indicated): not done  Assets:  Communication Skills  ADL's:  Intact  Cognition: WNL  Prognosis:  Good    DIAGNOSES:    ICD-10-CM   1. Major depressive disorder, recurrent episode, moderate (HCC) F33.1   2. Generalized anxiety disorder F41.1   3. Insomnia, unspecified type G47.00  RECOMMENDATIONS: Continue current meds.  If anxiety doesn't improve in Suzanne next month or so, call and we may need to increase Suzanne Lexapro.  She may also take Suzanne Ativan more often when needed, although understand her hesitancy to take it.  Return in 6 months or sooner as needed.    Donnal Moat, PA-C

## 2018-06-23 NOTE — Progress Notes (Signed)
[facility]  Telephone:(336) [phone] Fax:(336) [phone]  ID: [name] OB: May 24, 1971  MR#: [id number]  HYI#:[id number]  Patient Care Team: Patient, No Pcp Per as PCP - General (General Practice) [name], MD as Consulting Physician (Cardiology)  CHIEF COMPLAINT: Iron deficiency anemia due to chronic blood loss.  INTERVAL HISTORY: Patient returns to clinic today for repeat laboratory can further evaluation.  She has chronic weakness and fatigue which she relates to her job, but otherwise feels well.  She has no neurologic complaints.  She denies any recent fevers or illnesses.  She has a good appetite and denies weight loss.  She denies any chest pain, shortness of breath, hemoptysis, or cough.  She has no nausea, vomiting, constipation, or diarrhea.  She has no melena or hematochezia.  She has no urinary complaints.  Patient feels at her baseline offers no specific complaints today.  REVIEW OF SYSTEMS:   Review of Systems  Constitutional: Positive for malaise/fatigue. Negative for fever and weight loss.  Respiratory: Negative.  Negative for cough, hemoptysis and shortness of breath.   Cardiovascular: Negative.  Negative for chest pain and leg swelling.  Gastrointestinal: Negative.  Negative for abdominal pain, blood in stool and melena.  Genitourinary: Negative.  Negative for hematuria.  Musculoskeletal: Negative.  Negative for back pain.  Skin: Negative.  Negative for rash.  Neurological: Negative.  Negative for focal weakness, weakness and headaches.  Psychiatric/Behavioral: Negative.  The patient is not nervous/anxious.     As per HPI. Otherwise, a complete review of systems is negative.  PAST MEDICAL HISTORY: Past Medical History:  Diagnosis Date  . DDD (degenerative disc disease), lumbar   . Depression   . IBS (irritable bowel syndrome)   . Infertility, female   . Lactose intolerance   . Mitral valve prolapse    dx age 41, had echo done 10  years ago in [facility]  . Uterine fibroid     PAST SURGICAL HISTORY: Past Surgical History:  Procedure Laterality Date  . BALLOON DILATION N/A 08/02/2016   Procedure: BALLOON DILATION;  Surgeon: Arta Silence, MD;  Location: [facility];  Service: Endoscopy;  Laterality: N/A;  . CESAREAN SECTION     x1   . ESOPHAGOGASTRODUODENOSCOPY (EGD) WITH PROPOFOL N/A 08/02/2016   Procedure: ESOPHAGOGASTRODUODENOSCOPY (EGD) WITH PROPOFOL;  Surgeon: Arta Silence, MD;  Location: [facility];  Service: Endoscopy;  Laterality: N/A;  . LAPAROSCOPY     x 2 for endometriosis, and ovarian cysts  . WISDOM TOOTH EXTRACTION      FAMILY HISTORY: Family History  Problem Relation Age of Onset  . Atrial fibrillation Mother   . Atrial fibrillation Father        CHF, post ablative  . Breast cancer Paternal Grandmother   . Uterine cancer Paternal Grandmother   . Thyroid cancer Paternal Grandmother     ADVANCED DIRECTIVES (Y/N):  N  HEALTH MAINTENANCE: Social History   Tobacco Use  . Smoking status: Never Smoker  . Smokeless tobacco: Never Used  Substance Use Topics  . Alcohol use: Yes    Comment: 1 every 6 months  . Drug use: No     Colonoscopy:  PAP:  Bone density:  Lipid panel:  No Known Allergies  Current Outpatient Medications  Medication Sig Dispense Refill  . atenolol (TENORMIN) 25 MG tablet Take 1 tablet (25 mg total) by mouth every evening. Pt needs an appointment for further refills 30 tablet 0  . buPROPion (WELLBUTRIN XL) 150 MG 24 hr  tablet Take 1 tablet (150 mg total) by mouth 3 (three) times daily. 270 tablet 3  . escitalopram (LEXAPRO) 20 MG tablet Take 1 tablet (20 mg total) by mouth daily. 90 tablet 3  . LORazepam (ATIVAN) 0.5 MG tablet Take 1 tablet (0.5 mg total) by mouth every 8 (eight) hours as needed for anxiety. 30 tablet 1  . propranolol (INDERAL) 20 MG tablet Take 1 tablet (20 mg total) by mouth 3 (three) times daily as needed. 90 tablet 1  . traZODone (DESYREL) 50  MG tablet Take 1 tablet (50 mg total) by mouth at bedtime as needed for sleep. CAN TAKE 1-2 QHS PRN 180 tablet 3  . ibuprofen (ADVIL,MOTRIN) 200 MG tablet Take 600 mg by mouth 2 (two) times daily as needed for headache or moderate pain.     No current facility-administered medications for this visit.     OBJECTIVE: Vitals:   06/25/18 1312  BP: 114/77  Pulse: 64  Resp: 18  Temp: (!) 96.2 F (35.7 C)     Body mass index is 32.28 kg/m.    ECOG FS:0 - Asymptomatic  General: Well-developed, well-nourished, no acute distress. Eyes: Pink conjunctiva, anicteric sclera. HEENT: Normocephalic, moist mucous membranes. Lungs: Clear to auscultation bilaterally. Heart: Regular rate and rhythm. No rubs, murmurs, or gallops. Abdomen: Soft, nontender, nondistended. No organomegaly noted, normoactive bowel sounds. Musculoskeletal: No edema, cyanosis, or clubbing. Neuro: Alert, answering all questions appropriately. Cranial nerves grossly intact. Skin: No rashes or petechiae noted. Psych: Normal affect.  LAB RESULTS:  Lab Results  Component Value Date   NA 138 06/07/2012   K 4.1 06/07/2012   CL 104 06/07/2012   CO2 25 06/07/2012   GLUCOSE 114 (H) 06/07/2012   BUN 13 06/07/2012   CREATININE 0.9 06/07/2012   CALCIUM 9.2 06/07/2012   PROT 7.2 06/07/2012   ALBUMIN 4.0 06/07/2012   AST 20 06/07/2012   ALT 13 06/07/2012   ALKPHOS 44 06/07/2012   BILITOT 0.5 06/07/2012    Lab Results  Component Value Date   WBC 7.0 06/25/2018   NEUTROABS 4.5 06/25/2018   HGB 12.1 06/25/2018   HCT 36.8 06/25/2018   MCV 84.2 06/25/2018   PLT 208 06/25/2018   Lab Results  Component Value Date   IRON 53 06/25/2018   TIBC 386 06/25/2018   IRONPCTSAT 14 06/25/2018   Lab Results  Component Value Date   FERRITIN 8 (L) 06/25/2018     STUDIES: No results found.  ASSESSMENT: Iron deficiency anemia due to chronic blood loss.  PLAN:    1. Iron deficiency anemia due to chronic blood loss: Secondary  to patient's heavy menses.  Patient's hemoglobin continues to be within normal limits at 12.1.  Her iron stores are also within normal limits but her ferritin has trended down slightly.  She does not require additional Feraheme at this time.  Her last infusion occurred on October 25, 2017.  Return to clinic in 4 months with repeat laboratory can further evaluation.  If patient does not require Feraheme at this visit, she likely can be discharged from clinic.  2.  Heavy menses: Continue evaluation and treatment per gynecology.  I spent a total of 20 minutes face-to-face with the patient of which greater than 50% of the visit was spent in counseling and coordination of care as detailed above.   Patient expressed understanding and was in agreement with this plan. She also understands that She can call clinic at any time with any questions, concerns, or  complaints.    Lloyd Huger, MD   06/26/2018 4:48 PM

## 2018-06-25 ENCOUNTER — Other Ambulatory Visit: Payer: Self-pay

## 2018-06-25 ENCOUNTER — Inpatient Hospital Stay (HOSPITAL_BASED_OUTPATIENT_CLINIC_OR_DEPARTMENT_OTHER): Payer: 59 | Admitting: Oncology

## 2018-06-25 ENCOUNTER — Inpatient Hospital Stay: Payer: 59 | Attending: Oncology

## 2018-06-25 ENCOUNTER — Inpatient Hospital Stay: Payer: 59

## 2018-06-25 VITALS — BP 114/77 | HR 64 | Temp 96.2°F | Resp 18 | Wt 225.0 lb

## 2018-06-25 DIAGNOSIS — N939 Abnormal uterine and vaginal bleeding, unspecified: Secondary | ICD-10-CM

## 2018-06-25 DIAGNOSIS — D500 Iron deficiency anemia secondary to blood loss (chronic): Secondary | ICD-10-CM | POA: Diagnosis not present

## 2018-06-25 LAB — IRON AND TIBC
IRON: 53 ug/dL (ref 28–170)
Saturation Ratios: 14 % (ref 10.4–31.8)
TIBC: 386 ug/dL (ref 250–450)
UIBC: 333 ug/dL

## 2018-06-25 LAB — CBC WITH DIFFERENTIAL/PLATELET
Abs Immature Granulocytes: 0.03 10*3/uL (ref 0.00–0.07)
BASOS PCT: 0 %
Basophils Absolute: 0 10*3/uL (ref 0.0–0.1)
Eosinophils Absolute: 0.2 10*3/uL (ref 0.0–0.5)
Eosinophils Relative: 3 %
HCT: 36.8 % (ref 36.0–46.0)
HEMOGLOBIN: 12.1 g/dL (ref 12.0–15.0)
Immature Granulocytes: 0 %
LYMPHS PCT: 26 %
Lymphs Abs: 1.8 10*3/uL (ref 0.7–4.0)
MCH: 27.7 pg (ref 26.0–34.0)
MCHC: 32.9 g/dL (ref 30.0–36.0)
MCV: 84.2 fL (ref 80.0–100.0)
MONO ABS: 0.4 10*3/uL (ref 0.1–1.0)
MONOS PCT: 6 %
Neutro Abs: 4.5 10*3/uL (ref 1.7–7.7)
Neutrophils Relative %: 65 %
Platelets: 208 10*3/uL (ref 150–400)
RBC: 4.37 MIL/uL (ref 3.87–5.11)
RDW: 12.5 % (ref 11.5–15.5)
WBC: 7 10*3/uL (ref 4.0–10.5)
nRBC: 0 % (ref 0.0–0.2)

## 2018-06-25 LAB — FERRITIN: FERRITIN: 8 ng/mL — AB (ref 11–307)

## 2018-06-25 NOTE — Progress Notes (Signed)
Here for follow up. Per pt energy level" low"  Per pt no energy outside of work. Minimal sob w stair climbing per pt   97% on RA -pulse ox

## 2018-06-26 ENCOUNTER — Telehealth: Payer: Self-pay | Admitting: *Deleted

## 2018-06-26 NOTE — Telephone Encounter (Signed)
Asking what needs to be done regarding her results  ...   Ref Range & Units 1d ago  Iron 28 - 170 ug/dL 53   TIBC 250 - 450 ug/dL 386   Saturation Ratios 10.4 - 31.8 % 14   UIBC ug/dL 333   Comment: Performed at [facility], [address] [place], [place] [address]  Resulting Agency  [facility]      Specimen Collected: 06/25/18 12:44 Last Resulted: 06/25/18 15:21         ...   Ref Range & Units 1d ago  Ferritin 11 - 307 ng/mL 8Low    Comment: Performed at [facility], [address] [place], [place] [address]  Resulting Agency  [facility]      Specimen Collected: 06/25/18 12:44  Last Resulted: 06/25/18 15:21       ...   Ref Range & Units 1d ago  WBC 4.0 - 10.5 K/uL 7.0   RBC 3.87 - 5.11 MIL/uL 4.37   Hemoglobin 12.0 - 15.0 g/dL 12.1   HCT 36.0 - 46.0 % 36.8   MCV 80.0 - 100.0 fL 84.2   MCH 26.0 - 34.0 pg 27.7   MCHC 30.0 - 36.0 g/dL 32.9   RDW 11.5 - 15.5 % 12.5   Platelets 150 - 400 K/uL 208   nRBC 0.0 - 0.2 % 0.0   Neutrophils Relative % % 65   Neutro Abs 1.7 - 7.7 K/uL 4.5   Lymphocytes Relative % 26   Lymphs Abs 0.7 - 4.0 K/uL 1.8   Monocytes Relative % 6   Monocytes Absolute 0.1 - 1.0 K/uL 0.4   Eosinophils Relative % 3   Eosinophils Absolute 0.0 - 0.5 K/uL 0.2   Basophils Relative % 0   Basophils Absolute 0.0 - 0.1 K/uL 0.0   Immature Granulocytes % 0   Abs Immature Granulocytes 0.00 - 0.07 K/uL 0.03   Comment: Performed at [facility], [address] [place], [place] [address]  Resulting Agency  [facility]      Specimen Collected: 06/25/18 12:44  Last Resulted: 06/25/18 13:07

## 2018-06-26 NOTE — Telephone Encounter (Signed)
Nothing for now.  Keep f/u as scheduled in 4 months.

## 2018-06-26 NOTE — Telephone Encounter (Signed)
Patient informed of physician response

## 2018-07-04 ENCOUNTER — Other Ambulatory Visit: Payer: Self-pay | Admitting: *Deleted

## 2018-07-04 MED ORDER — ATENOLOL 25 MG PO TABS: 25.0000 mg | ORAL_TABLET | Freq: Every evening | ORAL | 0 refills | Status: DC

## 2018-07-16 NOTE — Progress Notes (Signed)
Cardiology Office Note  Date:  07/17/2018   ID:  Suzanne Foster, Suzanne Foster 03-04-1971, MRN [id number]  PCP:  Patient, No Pcp Per   Chief Complaint  Patient presents with  . other    needs refills today has intermitant CP and SOB. Medications reviewed verbally.     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  Started on propranolol for APCs Reports that she is doing well on atenolol daily at 25 mg Rarely has to add propranolol for breakthrough tachycardia or palpitations Stressful job working in the hospital Overall happy with her medications Rare atypical type chest discomfort not brought on with exertion Had this for many years, no escalation in frequency or intensity  EKG personally reviewed by myself on todays visit Shows normal sinus rhythm with rate 75 bpm no significant ST or T wave changes  Previously telemetry in the hospital and they noticed arrhythmia, lots of ectopy  Active at baseline  EKG personally reviewed by myself on todays visit Shows normal sinus rhythm 75 bpm no significant ST or T wave changes  Other past medical history reviewed Previous diagnosis of mitral broke prolapse when she was living in [place], was told she had mild prolapse   echocardiogram 2012, this has been requested for our records as it is not in our system  Review prior notes indicates chest pain back in 2012, was increasing in frequency at that time, atypical features, comes on at rest, 8/10 in intensity. It is a shooting-type sharp pain over the left breast that is deep.  can take her breath away. Symptoms are worse with deep inspiration.   In 2012, She also had palpitations that have been on daily basis. Typically comes on it rest. She's had these for a long time.   She denies excess caffeine, normal thyroid in  October last year. She reports parents have atrial fibrillation.    PMH:   has a past medical history of DDD (degenerative disc disease), lumbar, Depression, IBS (irritable bowel syndrome), Infertility, female, Lactose intolerance, Mitral valve prolapse, and Uterine fibroid.  PSH:    Past Surgical History:  Procedure Laterality Date  . BALLOON DILATION N/A 08/02/2016   Procedure: BALLOON DILATION;  Surgeon: Arta Silence, MD;  Location: [facility];  Service: Endoscopy;  Laterality: N/A;  . CESAREAN SECTION     x1   . ESOPHAGOGASTRODUODENOSCOPY (EGD) WITH PROPOFOL N/A 08/02/2016   Procedure: ESOPHAGOGASTRODUODENOSCOPY (EGD) WITH PROPOFOL;  Surgeon: Arta Silence, MD;  Location: [facility];  Service: Endoscopy;  Laterality: N/A;  . LAPAROSCOPY     x 2 for endometriosis, and ovarian cysts  . WISDOM TOOTH EXTRACTION      Current Outpatient Medications  Medication Sig Dispense Refill  . atenolol (TENORMIN) 25 MG tablet Take 1 tablet (25 mg total) by mouth every evening. Pt needs an appointment for further refills 30 tablet 0  . buPROPion (WELLBUTRIN XL) 150 MG 24 hr tablet Take 1 tablet (150 mg total) by mouth 3 (three) times daily. 270 tablet 3  . escitalopram (LEXAPRO) 20 MG tablet Take 1 tablet (20 mg total) by mouth daily. 90 tablet 3  . ibuprofen (ADVIL,MOTRIN) 200 MG tablet Take 600 mg by mouth 2 (two) times daily as needed for headache or moderate pain.    [name] LORazepam (ATIVAN) 0.5 MG tablet Take 1  tablet (0.5 mg total) by mouth every 8 (eight) hours as needed for anxiety. 30 tablet 1  . propranolol (INDERAL) 20 MG tablet Take 1 tablet (20 mg total) by mouth 3 (three) times daily as needed. 90 tablet 1  . traZODone (DESYREL) 50 MG tablet Take 1 tablet (50 mg total) by mouth at bedtime as needed for sleep. CAN TAKE 1-2 QHS PRN 180 tablet 3   No current facility-administered medications for this visit.      Allergies:   Patient has no known allergies.   Social History:  The  patient  reports that she has never smoked. She has never used smokeless tobacco. She reports that she drinks alcohol. She reports that she does not use drugs.   Family History:   family history includes Atrial fibrillation in her father and mother; Breast cancer in her paternal grandmother; Thyroid cancer in her paternal grandmother; Uterine cancer in her paternal grandmother.    Review of Systems: Review of Systems  Constitutional: Negative.   Respiratory: Negative.   Cardiovascular: Positive for palpitations.  Gastrointestinal: Negative.   Musculoskeletal: Negative.   Neurological: Negative.   Psychiatric/Behavioral: Negative.   All other systems reviewed and are negative.   PHYSICAL EXAM: VS:  BP 138/78 (BP Location: Left Arm, Patient Position: Sitting, Cuff Size: Normal)   Pulse 75   Ht 5' 10"  (1.778 m)   Wt 223 lb 8 oz (101.4 kg)   BMI 32.07 kg/m  , BMI Body mass index is 32.07 kg/m. Constitutional:  oriented to person, place, and time. No distress.  HENT:  Head: Grossly normal Eyes:  no discharge. No scleral icterus.  Neck: No JVD, no carotid bruits  Cardiovascular: Regular rate and rhythm, no murmurs appreciated Pulmonary/Chest: Clear to auscultation bilaterally, no wheezes or rails Abdominal: Soft.  no distension.  no tenderness.  Musculoskeletal: Normal range of motion Neurological:  normal muscle tone. Coordination normal. No atrophy Skin: Skin warm and dry Psychiatric: normal affect, pleasant  Recent Labs: 06/25/2018: Hemoglobin 12.1; Platelets 208   Lipid Panel No results found for: CHOL, HDL, LDLCALC, TRIG    Wt Readings from Last 3 Encounters:  07/17/18 223 lb 8 oz (101.4 kg)  06/25/18 225 lb (102.1 kg)  12/05/17 222 lb 9.6 oz (101 kg)       ASSESSMENT AND PLAN:  Palpitations - Plan: EKG 12-Lead, LONG TERM MONITOR (3-14 DAYS) Likely having APCs, possibly even PVCs Well-controlled on atenolol, very rare doses of propranolol Medications  refilled  MITRAL VALVE PROLAPSE No significant murmur appreciated on exam Continued surveillance  Atypical chest pain If symptoms get worse, options include CT coronary calcium scoring for risk stratification, routine treadmill study or treadmill stress echo Denies any progression or increase in intensity Not associated with exertion, started many years ago, atypical in nature  Dizziness Not an active issue   Total encounter time more than 15 minutes  Greater than 50% was spent in counseling and coordination of care with the patient   Disposition:   F/U as needed Medications refilled   No orders of the defined types were placed in this encounter.    Signed, Esmond Plants, M.D., Ph.D. 07/17/2018  [place], [place]

## 2018-07-17 ENCOUNTER — Ambulatory Visit: Payer: 59 | Admitting: Cardiovascular Disease

## 2018-07-17 ENCOUNTER — Encounter: Payer: Self-pay | Admitting: Cardiovascular Disease

## 2018-07-17 VITALS — BP 138/78 | HR 75 | Ht 70.0 in | Wt 223.5 lb

## 2018-07-17 DIAGNOSIS — R079 Chest pain, unspecified: Secondary | ICD-10-CM | POA: Diagnosis not present

## 2018-07-17 DIAGNOSIS — R002 Palpitations: Secondary | ICD-10-CM

## 2018-07-17 MED ORDER — PROPRANOLOL HCL 20 MG PO TABS: 20.0000 mg | ORAL_TABLET | Freq: Three times a day (TID) | ORAL | 6 refills | Status: DC | PRN

## 2018-07-17 MED ORDER — ATENOLOL 25 MG PO TABS: 25.0000 mg | ORAL_TABLET | Freq: Every evening | ORAL | 4 refills | Status: DC

## 2018-07-17 NOTE — Patient Instructions (Signed)
Medication Instructions:  No changes  If you need a refill on your cardiac medications before your next appointment, please call your pharmacy.    Lab work: No new labs needed   If you have labs (blood work) drawn today and your tests are completely normal, you will receive your results only by: [name] MyChart Message (if you have MyChart) OR . A paper copy in the mail If you have any lab test that is abnormal or we need to change your treatment, we will call you to review the results.   Testing/Procedures: No new testing needed   Follow-Up: At [facility], you and your health needs are our priority.  As part of our continuing mission to provide you with exceptional heart care, we have created designated Provider Care Teams.  These Care Teams include your primary Cardiologist (physician) and Advanced Practice Providers (APPs -  Physician Assistants and Nurse Practitioners) who all work together to provide you with the care you need, when you need it.  . You will need a follow up appointment  as needed .  [name] Providers on your designated Care Team:   . Murray Hodgkins, NP . Christell Faith, PA-C . Marrianne Mood, PA-C  Any Other Special Instructions Will Be Listed Below (If Applicable).  For educational health videos Log in to : [email or web address] Or : [email or web address], password : [email or web address]

## 2018-09-24 DIAGNOSIS — M5412 Radiculopathy, cervical region: Secondary | ICD-10-CM | POA: Diagnosis not present

## 2018-09-24 DIAGNOSIS — Z6832 Body mass index (BMI) 32.0-32.9, adult: Secondary | ICD-10-CM | POA: Diagnosis not present

## 2018-09-24 DIAGNOSIS — M542 Cervicalgia: Secondary | ICD-10-CM | POA: Diagnosis not present

## 2018-09-25 ENCOUNTER — Other Ambulatory Visit
Admission: RE | Admit: 2018-09-25 | Discharge: 2018-09-25 | Disposition: A | Discharge status: Home / Self Care | Payer: 59 | Source: Ambulatory Visit | Attending: Internal Medicine | Admitting: Internal Medicine

## 2018-09-25 ENCOUNTER — Other Ambulatory Visit: Payer: Self-pay | Admitting: Neurosurgery

## 2018-09-25 DIAGNOSIS — E611 Iron deficiency: Secondary | ICD-10-CM | POA: Diagnosis not present

## 2018-09-25 LAB — CBC WITH DIFFERENTIAL/PLATELET
Abs Immature Granulocytes: 0.02 10*3/uL (ref 0.00–0.07)
Basophils Absolute: 0 10*3/uL (ref 0.0–0.1)
Basophils Relative: 0 %
Eosinophils Absolute: 0.3 10*3/uL (ref 0.0–0.5)
Eosinophils Relative: 4 %
HCT: 35.5 % — ABNORMAL LOW (ref 36.0–46.0)
Hemoglobin: 11.6 g/dL — ABNORMAL LOW (ref 12.0–15.0)
Immature Granulocytes: 0 %
Lymphocytes Relative: 30 %
Lymphs Abs: 1.9 10*3/uL (ref 0.7–4.0)
MCH: 27.8 pg (ref 26.0–34.0)
MCHC: 32.7 g/dL (ref 30.0–36.0)
MCV: 84.9 fL (ref 80.0–100.0)
Monocytes Absolute: 0.4 10*3/uL (ref 0.1–1.0)
Monocytes Relative: 6 %
Neutro Abs: 3.8 10*3/uL (ref 1.7–7.7)
Neutrophils Relative %: 60 %
Platelets: 236 10*3/uL (ref 150–400)
RBC: 4.18 MIL/uL (ref 3.87–5.11)
RDW: 13.8 % (ref 11.5–15.5)
WBC: 6.4 10*3/uL (ref 4.0–10.5)
nRBC: 0 % (ref 0.0–0.2)

## 2018-09-25 LAB — IRON AND TIBC
Iron: 60 ug/dL (ref 28–170)
Saturation Ratios: 16 % (ref 10.4–31.8)
TIBC: 387 ug/dL (ref 250–450)
UIBC: 327 ug/dL

## 2018-09-25 LAB — FERRITIN: Ferritin: 14 ng/mL (ref 11–307)

## 2018-10-01 ENCOUNTER — Other Ambulatory Visit: Payer: Self-pay | Admitting: Neurosurgery

## 2018-10-01 DIAGNOSIS — M5412 Radiculopathy, cervical region: Secondary | ICD-10-CM

## 2018-10-07 DIAGNOSIS — Z6832 Body mass index (BMI) 32.0-32.9, adult: Secondary | ICD-10-CM | POA: Diagnosis not present

## 2018-10-07 DIAGNOSIS — N72 Inflammatory disease of cervix uteri: Secondary | ICD-10-CM | POA: Diagnosis not present

## 2018-10-07 DIAGNOSIS — Z1389 Encounter for screening for other disorder: Secondary | ICD-10-CM | POA: Diagnosis not present

## 2018-10-07 DIAGNOSIS — Z01419 Encounter for gynecological examination (general) (routine) without abnormal findings: Secondary | ICD-10-CM | POA: Diagnosis not present

## 2018-10-07 DIAGNOSIS — N939 Abnormal uterine and vaginal bleeding, unspecified: Secondary | ICD-10-CM | POA: Diagnosis not present

## 2018-10-07 DIAGNOSIS — D259 Leiomyoma of uterus, unspecified: Secondary | ICD-10-CM | POA: Diagnosis not present

## 2018-10-09 ENCOUNTER — Ambulatory Visit
Admission: RE | Admit: 2018-10-09 | Discharge: 2018-10-09 | Disposition: A | Discharge status: Home / Self Care | Payer: 59 | Source: Ambulatory Visit | Attending: Neurosurgery | Admitting: Neurosurgery

## 2018-10-09 DIAGNOSIS — M5412 Radiculopathy, cervical region: Secondary | ICD-10-CM | POA: Diagnosis not present

## 2018-10-09 DIAGNOSIS — M542 Cervicalgia: Secondary | ICD-10-CM | POA: Diagnosis not present

## 2018-10-15 ENCOUNTER — Encounter: Payer: Self-pay | Admitting: Emergency Medicine

## 2018-10-15 DIAGNOSIS — F329 Major depressive disorder, single episode, unspecified: Secondary | ICD-10-CM | POA: Insufficient documentation

## 2018-10-15 DIAGNOSIS — F419 Anxiety disorder, unspecified: Secondary | ICD-10-CM

## 2018-10-15 DIAGNOSIS — G4700 Insomnia, unspecified: Secondary | ICD-10-CM

## 2018-10-18 DIAGNOSIS — M5020 Other cervical disc displacement, unspecified cervical region: Secondary | ICD-10-CM | POA: Diagnosis not present

## 2018-10-18 DIAGNOSIS — M4722 Other spondylosis with radiculopathy, cervical region: Secondary | ICD-10-CM | POA: Diagnosis not present

## 2018-10-18 DIAGNOSIS — I10 Essential (primary) hypertension: Secondary | ICD-10-CM | POA: Diagnosis not present

## 2018-10-18 DIAGNOSIS — Z6832 Body mass index (BMI) 32.0-32.9, adult: Secondary | ICD-10-CM | POA: Diagnosis not present

## 2018-10-18 NOTE — Progress Notes (Unsigned)
[facility]  Telephone:([phone]) [phone] Fax:([phone]) [phone]  ID: [name] OB: 1970/10/22  MR#: [id number]  VOJ#:[id number]  Patient Care Team: Patient, No Pcp Per as PCP - General (General Practice) [name], MD as Consulting Physician (Cardiology)  CHIEF COMPLAINT: Iron deficiency anemia due to chronic blood loss.  INTERVAL HISTORY: Patient returns to clinic today for repeat laboratory can further evaluation.  She has chronic weakness and fatigue which she relates to her job, but otherwise feels well.  She has no neurologic complaints.  She denies any recent fevers or illnesses.  She has a good appetite and denies weight loss.  She denies any chest pain, shortness of breath, hemoptysis, or cough.  She has no nausea, vomiting, constipation, or diarrhea.  She has no melena or hematochezia.  She has no urinary complaints.  Patient feels at her baseline offers no specific complaints today.  REVIEW OF SYSTEMS:   Review of Systems  Constitutional: Positive for malaise/fatigue. Negative for fever and weight loss.  Respiratory: Negative.  Negative for cough, hemoptysis and shortness of breath.   Cardiovascular: Negative.  Negative for chest pain and leg swelling.  Gastrointestinal: Negative.  Negative for abdominal pain, blood in stool and melena.  Genitourinary: Negative.  Negative for hematuria.  Musculoskeletal: Negative.  Negative for back pain.  Skin: Negative.  Negative for rash.  Neurological: Negative.  Negative for focal weakness, weakness and headaches.  Psychiatric/Behavioral: Negative.  The patient is not nervous/anxious.     As per HPI. Otherwise, a complete review of systems is negative.  PAST MEDICAL HISTORY: Past Medical History:  Diagnosis Date  . DDD (degenerative disc disease), lumbar   . Depression   . IBS (irritable bowel syndrome)   . Infertility, female   . Lactose intolerance   . Mitral valve prolapse    dx age 50, had echo done 10  years ago in [facility]  . Uterine fibroid     PAST SURGICAL HISTORY: Past Surgical History:  Procedure Laterality Date  . BALLOON DILATION N/A 08/02/2016   Procedure: BALLOON DILATION;  Surgeon: Arta Silence, MD;  Location: [facility];  Service: Endoscopy;  Laterality: N/A;  . CESAREAN SECTION     x1   . ESOPHAGOGASTRODUODENOSCOPY (EGD) WITH PROPOFOL N/A 08/02/2016   Procedure: ESOPHAGOGASTRODUODENOSCOPY (EGD) WITH PROPOFOL;  Surgeon: Arta Silence, MD;  Location: [facility];  Service: Endoscopy;  Laterality: N/A;  . LAPAROSCOPY     x 2 for endometriosis, and ovarian cysts  . WISDOM TOOTH EXTRACTION      FAMILY HISTORY: Family History  Problem Relation Age of Onset  . Atrial fibrillation Mother   . Atrial fibrillation Father        CHF, post ablative  . Breast cancer Paternal Grandmother   . Uterine cancer Paternal Grandmother   . Thyroid cancer Paternal Grandmother     ADVANCED DIRECTIVES (Y/N):  N  HEALTH MAINTENANCE: Social History   Tobacco Use  . Smoking status: Never Smoker  . Smokeless tobacco: Never Used  Substance Use Topics  . Alcohol use: Yes    Comment: 1 every 6 months  . Drug use: No     Colonoscopy:  PAP:  Bone density:  Lipid panel:  No Known Allergies  Current Outpatient Medications  Medication Sig Dispense Refill  . atenolol (TENORMIN) 25 MG tablet Take 1 tablet (25 mg total) by mouth every evening. 90 tablet 4  . buPROPion (WELLBUTRIN XL) 150 MG 24 hr tablet Take 1 tablet (150 mg total)  by mouth 3 (three) times daily. 270 tablet 3  . escitalopram (LEXAPRO) 20 MG tablet Take 1 tablet (20 mg total) by mouth daily. 90 tablet 3  . ibuprofen (ADVIL,MOTRIN) 200 MG tablet Take 600 mg by mouth 2 (two) times daily as needed for headache or moderate pain.    [name] LORazepam (ATIVAN) 0.5 MG tablet Take 1 tablet (0.5 mg total) by mouth every 8 (eight) hours as needed for anxiety. 30 tablet 1  . propranolol (INDERAL) 20 MG tablet Take 1 tablet (20 mg total)  by mouth 3 (three) times daily as needed. 90 tablet 6  . traZODone (DESYREL) 50 MG tablet Take 1 tablet (50 mg total) by mouth at bedtime as needed for sleep. CAN TAKE 1-2 QHS PRN 180 tablet 3   No current facility-administered medications for this visit.     OBJECTIVE: There were no vitals filed for this visit.   There is no height or weight on file to calculate BMI.    ECOG FS:0 - Asymptomatic  General: Well-developed, well-nourished, no acute distress. Eyes: Pink conjunctiva, anicteric sclera. HEENT: Normocephalic, moist mucous membranes. Lungs: Clear to auscultation bilaterally. Heart: Regular rate and rhythm. No rubs, murmurs, or gallops. Abdomen: Soft, nontender, nondistended. No organomegaly noted, normoactive bowel sounds. Musculoskeletal: No edema, cyanosis, or clubbing. Neuro: Alert, answering all questions appropriately. Cranial nerves grossly intact. Skin: No rashes or petechiae noted. Psych: Normal affect.  LAB RESULTS:  Lab Results  Component Value Date   NA 138 06/07/2012   K 4.1 06/07/2012   CL 104 06/07/2012   CO2 25 06/07/2012   GLUCOSE 114 (H) 06/07/2012   BUN 13 06/07/2012   CREATININE 0.9 06/07/2012   CALCIUM 9.2 06/07/2012   PROT 7.2 06/07/2012   ALBUMIN 4.0 06/07/2012   AST 20 06/07/2012   ALT 13 06/07/2012   ALKPHOS 44 06/07/2012   BILITOT 0.5 06/07/2012    Lab Results  Component Value Date   WBC 6.4 09/25/2018   NEUTROABS 3.8 09/25/2018   HGB 11.6 (L) 09/25/2018   HCT 35.5 (L) 09/25/2018   MCV 84.9 09/25/2018   PLT 236 09/25/2018   Lab Results  Component Value Date   IRON 60 09/25/2018   TIBC 387 09/25/2018   IRONPCTSAT 16 09/25/2018   Lab Results  Component Value Date   FERRITIN 14 09/25/2018     STUDIES: Mr Cervical Spine Wo Contrast  Result Date: 10/09/2018 CLINICAL DATA:  Left-sided neck pain for 6 months EXAM: MRI CERVICAL SPINE WITHOUT CONTRAST TECHNIQUE: Multiplanar, multisequence MR imaging of the cervical spine was  performed. No intravenous contrast was administered. COMPARISON:  None. FINDINGS: Alignment: Physiologic. Vertebrae: No fracture, evidence of discitis, or bone lesion. Cord: Normal signal and morphology. Posterior Fossa, vertebral arteries, paraspinal tissues: Posterior fossa demonstrates no focal abnormality. Vertebral artery flow voids are maintained. Paraspinal soft tissues are unremarkable. Disc levels: Discs: Degenerative disc disease with disc height loss at C5-6 and C6-7. C2-3: No significant disc bulge. No neural foraminal stenosis. No central canal stenosis. C3-4: Minimal broad-based disc bulge. No neural foraminal stenosis. No central canal stenosis. C4-5: Minimal broad-based disc bulge. No neural foraminal stenosis. No central canal stenosis. C5-6: Broad-based disc bulge with prominent bilateral foraminal components. Moderate left and severe right foraminal stenosis. Mild spinal stenosis. C6-7: Broad-based disc bulge with a left paracentral disc protrusion. Mild right foraminal stenosis. Mild left foraminal stenosis. No central canal stenosis. C7-T1: No significant disc bulge. No neural foraminal stenosis. No central canal stenosis. IMPRESSION: 1. At C6-7  there is a broad-based disc bulge with a left paracentral disc protrusion. Mild right foraminal stenosis. Mild left foraminal stenosis. 2. At C5-6 there is a broad-based disc bulge with prominent bilateral foraminal components. Moderate left and severe right foraminal stenosis. Mild spinal stenosis. Electronically Signed   By: Kathreen Devoid   On: 10/09/2018 08:42    ASSESSMENT: Iron deficiency anemia due to chronic blood loss.  PLAN:    1. Iron deficiency anemia due to chronic blood loss: Secondary to patient's heavy menses.  Patient's hemoglobin continues to be within normal limits at 12.1.  Her iron stores are also within normal limits but her ferritin has trended down slightly.  She does not require additional Feraheme at this time.  Her last  infusion occurred on October 25, 2017.  Return to clinic in 4 months with repeat laboratory can further evaluation.  If patient does not require Feraheme at this visit, she likely can be discharged from clinic.  2.  Heavy menses: Continue evaluation and treatment per gynecology.  I spent a total of 20 minutes face-to-face with the patient of which greater than 50% of the visit was spent in counseling and coordination of care as detailed above.   Patient expressed understanding and was in agreement with this plan. She also understands that She can call clinic at any time with any questions, concerns, or complaints.    Lloyd Huger, MD   10/18/2018 11:38 AM

## 2018-10-22 ENCOUNTER — Ambulatory Visit: Payer: 59

## 2018-10-22 ENCOUNTER — Inpatient Hospital Stay: Payer: 59 | Admitting: Oncology

## 2018-10-22 ENCOUNTER — Inpatient Hospital Stay: Payer: 59

## 2018-10-22 ENCOUNTER — Ambulatory Visit: Payer: 59 | Admitting: Oncology

## 2018-10-22 ENCOUNTER — Other Ambulatory Visit: Payer: 59

## 2018-10-22 ENCOUNTER — Inpatient Hospital Stay: Payer: 59 | Attending: Oncology

## 2018-10-30 DIAGNOSIS — D259 Leiomyoma of uterus, unspecified: Secondary | ICD-10-CM | POA: Diagnosis not present

## 2018-10-30 DIAGNOSIS — D3132 Benign neoplasm of left choroid: Secondary | ICD-10-CM | POA: Diagnosis not present

## 2018-10-30 DIAGNOSIS — N939 Abnormal uterine and vaginal bleeding, unspecified: Secondary | ICD-10-CM | POA: Diagnosis not present

## 2018-12-19 ENCOUNTER — Ambulatory Visit: Payer: 59 | Admitting: Physician Assistant

## 2018-12-31 ENCOUNTER — Encounter: Payer: Self-pay | Admitting: Oncology

## 2018-12-31 ENCOUNTER — Other Ambulatory Visit: Payer: 59

## 2019-01-01 ENCOUNTER — Other Ambulatory Visit: Payer: Self-pay

## 2019-01-01 ENCOUNTER — Inpatient Hospital Stay: Payer: 59 | Attending: Oncology | Admitting: *Deleted

## 2019-01-01 DIAGNOSIS — D500 Iron deficiency anemia secondary to blood loss (chronic): Secondary | ICD-10-CM | POA: Insufficient documentation

## 2019-01-01 LAB — CBC WITH DIFFERENTIAL/PLATELET
Abs Immature Granulocytes: 0.02 10*3/uL (ref 0.00–0.07)
Basophils Absolute: 0 10*3/uL (ref 0.0–0.1)
Basophils Relative: 0 %
Eosinophils Absolute: 0.1 10*3/uL (ref 0.0–0.5)
Eosinophils Relative: 2 %
HCT: 34.7 % — ABNORMAL LOW (ref 36.0–46.0)
Hemoglobin: 11 g/dL — ABNORMAL LOW (ref 12.0–15.0)
Immature Granulocytes: 0 %
Lymphocytes Relative: 24 %
Lymphs Abs: 1.4 10*3/uL (ref 0.7–4.0)
MCH: 26 pg (ref 26.0–34.0)
MCHC: 31.7 g/dL (ref 30.0–36.0)
MCV: 82 fL (ref 80.0–100.0)
Monocytes Absolute: 0.4 10*3/uL (ref 0.1–1.0)
Monocytes Relative: 7 %
Neutro Abs: 4.1 10*3/uL (ref 1.7–7.7)
Neutrophils Relative %: 67 %
Platelets: 291 10*3/uL (ref 150–400)
RBC: 4.23 MIL/uL (ref 3.87–5.11)
RDW: 12.8 % (ref 11.5–15.5)
WBC: 6.1 10*3/uL (ref 4.0–10.5)
nRBC: 0 % (ref 0.0–0.2)

## 2019-01-01 LAB — IRON AND TIBC
Iron: 20 ug/dL — ABNORMAL LOW (ref 28–170)
Saturation Ratios: 4 % — ABNORMAL LOW (ref 10.4–31.8)
TIBC: 467 ug/dL — ABNORMAL HIGH (ref 250–450)
UIBC: 447 ug/dL

## 2019-01-01 LAB — FERRITIN: Ferritin: 5 ng/mL — ABNORMAL LOW (ref 11–307)

## 2019-01-07 ENCOUNTER — Encounter: Payer: Self-pay | Admitting: Oncology

## 2019-01-08 DIAGNOSIS — I491 Atrial premature depolarization: Secondary | ICD-10-CM | POA: Diagnosis not present

## 2019-01-08 DIAGNOSIS — D500 Iron deficiency anemia secondary to blood loss (chronic): Secondary | ICD-10-CM | POA: Diagnosis not present

## 2019-01-08 DIAGNOSIS — Z0001 Encounter for general adult medical examination with abnormal findings: Secondary | ICD-10-CM | POA: Diagnosis not present

## 2019-01-08 DIAGNOSIS — I341 Nonrheumatic mitral (valve) prolapse: Secondary | ICD-10-CM | POA: Diagnosis not present

## 2019-01-21 ENCOUNTER — Encounter: Payer: Self-pay | Admitting: Oncology

## 2019-01-22 ENCOUNTER — Telehealth: Payer: Self-pay | Admitting: Physician Assistant

## 2019-01-22 NOTE — Telephone Encounter (Signed)
Left voice mail to call back 

## 2019-01-22 NOTE — Telephone Encounter (Signed)
Suzanne Foster, please call and let her know that it is possible that the trazodone can do that.  If she is taking the Ativan very often, that can do it as well.  If she can do without the trazodone, it is fine to stop it.  If not though she can cut the dose in half and that may help with the cloudy thinking.  Also asked her to make an appointment with me.  I think she and I both have had to reschedule for some reason or another so she does not have another appointment on the books.I would recommend Dr. Rica Mote.  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

## 2019-01-22 NOTE — Telephone Encounter (Signed)
Patient left vm 05/05 @2 :03 wants to know if one of the side affects of Trazedone is cloudy minded or forgetfulness.  Wants to be referred to a Therapist

## 2019-01-24 ENCOUNTER — Telehealth: Payer: Self-pay | Admitting: *Deleted

## 2019-01-24 NOTE — Telephone Encounter (Signed)
Call returned and acknowledge that she called. I explained to her that the team has left for the day and someone will reach out to her on Monday to further discuss her concerns.

## 2019-01-24 NOTE — Telephone Encounter (Signed)
Patient left vm on triage. Requesting further explanation of why the IV iron infusions were post poned out to June. She feels that the iron infusion needs to be set up for next week as she is symptomatic. She is requesting to have her apts asap.

## 2019-01-24 NOTE — Telephone Encounter (Signed)
Left voice mail to call back 

## 2019-01-27 NOTE — Telephone Encounter (Signed)
I'll wait on instruction from Breese as to scheduling of patient.

## 2019-01-27 NOTE — Telephone Encounter (Signed)
Ok, thank you

## 2019-01-27 NOTE — Telephone Encounter (Signed)
Call returned to patient this morning, she is agreeable to keeping the follow up as scheduled in June.

## 2019-02-14 ENCOUNTER — Other Ambulatory Visit: Payer: Self-pay

## 2019-02-14 DIAGNOSIS — D500 Iron deficiency anemia secondary to blood loss (chronic): Secondary | ICD-10-CM

## 2019-02-14 NOTE — Progress Notes (Signed)
[place]  [phone]) [phone] Fax:(336) [phone]  ID: [name] OB: September 23, 1970  MR#: [id number]  QIO#:[id number]  Patient Care Team: Patient, No Pcp Per as PCP - General (General Practice) [name], MD as Consulting Physician (Cardiology)  CHIEF COMPLAINT: Iron deficiency anemia due to chronic blood loss.  INTERVAL HISTORY: Patient returns to clinic today for repeat laboratory work, further evaluation, and continuation of IV Feraheme.  She continues to have chronic weakness and fatigue that has been getting worse over the past several weeks, but she remains active and works full-time. She has no neurologic complaints.  She denies any recent fevers or illnesses.  She has a good appetite and denies weight loss.  She denies any chest pain, shortness of breath, hemoptysis, or cough.  She has no nausea, vomiting, constipation, or diarrhea.  She has no melena or hematochezia.  She has no urinary complaints.  Patient offers no further specific complaints today.  REVIEW OF SYSTEMS:   Review of Systems  Constitutional: Positive for malaise/fatigue. Negative for fever and weight loss.  Respiratory: Negative.  Negative for cough, hemoptysis and shortness of breath.   Cardiovascular: Negative.  Negative for chest pain and leg swelling.  Gastrointestinal: Negative.  Negative for abdominal pain, blood in stool and melena.  Genitourinary: Negative.  Negative for hematuria.  Musculoskeletal: Negative.  Negative for back pain.  Skin: Negative.  Negative for rash.  Neurological: Positive for weakness. Negative for focal weakness and headaches.  Psychiatric/Behavioral: Negative.  The patient is not nervous/anxious.     As per HPI. Otherwise, a complete review of systems is negative.  PAST MEDICAL HISTORY: Past Medical History:  Diagnosis Date  . DDD (degenerative disc disease), lumbar   . Depression   . IBS (irritable bowel syndrome)   . Infertility, female    . Lactose intolerance   . Mitral valve prolapse    dx age 43, had echo done 10 years ago in [facility]  . Uterine fibroid     PAST SURGICAL HISTORY: Past Surgical History:  Procedure Laterality Date  . BALLOON DILATION N/A 08/02/2016   Procedure: BALLOON DILATION;  Surgeon: Arta Silence, MD;  Location: [facility];  Service: Endoscopy;  Laterality: N/A;  . CESAREAN SECTION     x1   . ESOPHAGOGASTRODUODENOSCOPY (EGD) WITH PROPOFOL N/A 08/02/2016   Procedure: ESOPHAGOGASTRODUODENOSCOPY (EGD) WITH PROPOFOL;  Surgeon: Arta Silence, MD;  Location: [facility];  Service: Endoscopy;  Laterality: N/A;  . LAPAROSCOPY     x 2 for endometriosis, and ovarian cysts  . WISDOM TOOTH EXTRACTION      FAMILY HISTORY: Family History  Problem Relation Age of Onset  . Atrial fibrillation Mother   . Atrial fibrillation Father        CHF, post ablative  . Breast cancer Paternal Grandmother   . Uterine cancer Paternal Grandmother   . Thyroid cancer Paternal Grandmother     ADVANCED DIRECTIVES (Y/N):  N  HEALTH MAINTENANCE: Social History   Tobacco Use  . Smoking status: Never Smoker  . Smokeless tobacco: Never Used  Substance Use Topics  . Alcohol use: Yes    Comment: 1 every 6 months  . Drug use: No     Colonoscopy:  PAP:  Bone density:  Lipid panel:  No Known Allergies  Current Outpatient Medications  Medication Sig Dispense Refill  . atenolol (TENORMIN) 25 MG tablet Take 1 tablet (25 mg total) by mouth every evening. 90 tablet 4  . buPROPion (WELLBUTRIN XL) 150  MG 24 hr tablet Take 1 tablet (150 mg total) by mouth 3 (three) times daily. 270 tablet 3  . escitalopram (LEXAPRO) 20 MG tablet Take 1 tablet (20 mg total) by mouth daily. 90 tablet 3  . ibuprofen (ADVIL,MOTRIN) 200 MG tablet Take 600 mg by mouth 2 (two) times daily as needed for headache or moderate pain.    [name] LORazepam (ATIVAN) 0.5 MG tablet Take 1 tablet (0.5 mg total) by mouth every 8 (eight) hours as needed for  anxiety. 30 tablet 1  . propranolol (INDERAL) 20 MG tablet Take 1 tablet (20 mg total) by mouth 3 (three) times daily as needed. 90 tablet 6  . traZODone (DESYREL) 50 MG tablet Take 1 tablet (50 mg total) by mouth at bedtime as needed for sleep. CAN TAKE 1-2 QHS PRN 180 tablet 3   No current facility-administered medications for this visit.    Facility-Administered Medications Ordered in Other Visits  Medication Dose Route Frequency Provider Last Rate Last Dose  . 0.9 %  sodium chloride infusion   Intravenous Continuous Lloyd Huger, MD 10 mL/hr at 02/17/19 1441    . ferumoxytol (FERAHEME) 510 mg in sodium chloride 0.9 % 100 mL IVPB  510 mg Intravenous Once Lloyd Huger, MD 468 mL/hr at 02/17/19 1447 510 mg at 02/17/19 1447    OBJECTIVE: Vitals:   02/17/19 1327  BP: 126/86  Pulse: 83  Temp: 98.3 F (36.8 C)     Body mass index is 30.28 kg/m.    ECOG FS:0 - Asymptomatic  General: Well-developed, well-nourished, no acute distress. Eyes: Pink conjunctiva, anicteric sclera. HEENT: Normocephalic, moist mucous membranes. Lungs: Clear to auscultation bilaterally. Heart: Regular rate and rhythm. No rubs, murmurs, or gallops. Abdomen: Soft, nontender, nondistended. No organomegaly noted, normoactive bowel sounds. Musculoskeletal: No edema, cyanosis, or clubbing. Neuro: Alert, answering all questions appropriately. Cranial nerves grossly intact. Skin: No rashes or petechiae noted. Psych: Normal affect.  LAB RESULTS:  Lab Results  Component Value Date   NA 138 06/07/2012   K 4.1 06/07/2012   CL 104 06/07/2012   CO2 25 06/07/2012   GLUCOSE 114 (H) 06/07/2012   BUN 13 06/07/2012   CREATININE 0.9 06/07/2012   CALCIUM 9.2 06/07/2012   PROT 7.2 06/07/2012   ALBUMIN 4.0 06/07/2012   AST 20 06/07/2012   ALT 13 06/07/2012   ALKPHOS 44 06/07/2012   BILITOT 0.5 06/07/2012    Lab Results  Component Value Date   WBC 8.1 02/17/2019   NEUTROABS 5.5 02/17/2019   HGB 10.7  (L) 02/17/2019   HCT 34.4 (L) 02/17/2019   MCV 77.0 (L) 02/17/2019   PLT 308 02/17/2019   Lab Results  Component Value Date   IRON 20 (L) 01/01/2019   TIBC 467 (H) 01/01/2019   IRONPCTSAT 4 (L) 01/01/2019   Lab Results  Component Value Date   FERRITIN 5 (L) 01/01/2019     STUDIES: No results found.  ASSESSMENT: Iron deficiency anemia due to chronic blood loss.  PLAN:    1. Iron deficiency anemia due to chronic blood loss: Secondary to patient's heavy menses.  Patient's hemoglobin and iron stores have trended down and she is symptomatic.  Proceed with 510 mg IV Feraheme today.  Return to clinic in 1 week for second infusion.  Patient will then return to clinic in 3 months with repeat laboratory work and further evaluation.   2.  Heavy menses: Continue evaluation and treatment per gynecology.  I spent a total of 30  minutes face-to-face with the patient of which greater than 50% of the visit was spent in counseling and coordination of care as detailed above.  Patient expressed understanding and was in agreement with this plan. She also understands that She can call clinic at any time with any questions, concerns, or complaints.    Lloyd Huger, MD   02/17/2019 2:57 PM

## 2019-02-17 ENCOUNTER — Inpatient Hospital Stay (HOSPITAL_BASED_OUTPATIENT_CLINIC_OR_DEPARTMENT_OTHER): Payer: 59 | Admitting: Oncology

## 2019-02-17 ENCOUNTER — Inpatient Hospital Stay: Payer: 59

## 2019-02-17 ENCOUNTER — Inpatient Hospital Stay: Payer: 59 | Attending: Oncology

## 2019-02-17 ENCOUNTER — Encounter: Payer: Self-pay | Admitting: Oncology

## 2019-02-17 ENCOUNTER — Other Ambulatory Visit: Payer: Self-pay

## 2019-02-17 VITALS — BP 126/86 | HR 83 | Temp 98.3°F | Ht 70.0 in | Wt 211.0 lb

## 2019-02-17 DIAGNOSIS — D500 Iron deficiency anemia secondary to blood loss (chronic): Secondary | ICD-10-CM | POA: Insufficient documentation

## 2019-02-17 DIAGNOSIS — N920 Excessive and frequent menstruation with regular cycle: Secondary | ICD-10-CM | POA: Insufficient documentation

## 2019-02-17 DIAGNOSIS — Z79899 Other long term (current) drug therapy: Secondary | ICD-10-CM

## 2019-02-17 LAB — CBC WITH DIFFERENTIAL/PLATELET
Abs Immature Granulocytes: 0.02 10*3/uL (ref 0.00–0.07)
Basophils Absolute: 0 10*3/uL (ref 0.0–0.1)
Basophils Relative: 0 %
Eosinophils Absolute: 0.1 10*3/uL (ref 0.0–0.5)
Eosinophils Relative: 2 %
HCT: 34.4 % — ABNORMAL LOW (ref 36.0–46.0)
Hemoglobin: 10.7 g/dL — ABNORMAL LOW (ref 12.0–15.0)
Immature Granulocytes: 0 %
Lymphocytes Relative: 25 %
Lymphs Abs: 2 10*3/uL (ref 0.7–4.0)
MCH: 23.9 pg — ABNORMAL LOW (ref 26.0–34.0)
MCHC: 31.1 g/dL (ref 30.0–36.0)
MCV: 77 fL — ABNORMAL LOW (ref 80.0–100.0)
Monocytes Absolute: 0.4 10*3/uL (ref 0.1–1.0)
Monocytes Relative: 5 %
Neutro Abs: 5.5 10*3/uL (ref 1.7–7.7)
Neutrophils Relative %: 68 %
Platelets: 308 10*3/uL (ref 150–400)
RBC: 4.47 MIL/uL (ref 3.87–5.11)
RDW: 14.6 % (ref 11.5–15.5)
WBC: 8.1 10*3/uL (ref 4.0–10.5)
nRBC: 0 % (ref 0.0–0.2)

## 2019-02-17 LAB — FERRITIN: Ferritin: 6 ng/mL — ABNORMAL LOW (ref 11–307)

## 2019-02-17 LAB — IRON AND TIBC
Iron: 34 ug/dL (ref 28–170)
Saturation Ratios: 6 % — ABNORMAL LOW (ref 10.4–31.8)
TIBC: 561 ug/dL — ABNORMAL HIGH (ref 250–450)
UIBC: 527 ug/dL

## 2019-02-17 MED ORDER — SODIUM CHLORIDE 0.9 % IV SOLN
510.0000 mg | Freq: Once | INTRAVENOUS | Status: AC
  Administered 2019-02-17: 15:00:00 510 mg via INTRAVENOUS
  Filled 2019-02-17: qty 17

## 2019-02-17 MED ORDER — SODIUM CHLORIDE 0.9 % IV SOLN
INTRAVENOUS | Status: DC
  Administered 2019-02-17: 15:00:00 via INTRAVENOUS
  Filled 2019-02-17: qty 250

## 2019-02-17 NOTE — Progress Notes (Signed)
Patient stated that she had been feeling tired, fatigued, bruising easily, gets SOB on exertion and chills. Patient denied any blood loss. Patient denied fever, diarrhea, constipation, nausea or vomiting.

## 2019-02-20 ENCOUNTER — Other Ambulatory Visit: Payer: Self-pay | Admitting: Physician Assistant

## 2019-02-21 NOTE — Telephone Encounter (Signed)
Last visit 06/2018, due for 6 month follow up

## 2019-02-25 ENCOUNTER — Other Ambulatory Visit: Payer: Self-pay

## 2019-02-25 ENCOUNTER — Inpatient Hospital Stay: Payer: 59

## 2019-02-25 VITALS — BP 107/69 | HR 67 | Temp 97.9°F | Resp 18

## 2019-02-25 DIAGNOSIS — D500 Iron deficiency anemia secondary to blood loss (chronic): Secondary | ICD-10-CM | POA: Diagnosis not present

## 2019-02-25 DIAGNOSIS — N920 Excessive and frequent menstruation with regular cycle: Secondary | ICD-10-CM | POA: Diagnosis not present

## 2019-02-25 DIAGNOSIS — Z79899 Other long term (current) drug therapy: Secondary | ICD-10-CM | POA: Diagnosis not present

## 2019-02-25 MED ORDER — SODIUM CHLORIDE 0.9 % IV SOLN
510.0000 mg | Freq: Once | INTRAVENOUS | Status: AC
  Administered 2019-02-25: 510 mg via INTRAVENOUS
  Filled 2019-02-25: qty 17

## 2019-02-25 MED ORDER — SODIUM CHLORIDE 0.9 % IV SOLN
Freq: Once | INTRAVENOUS | Status: AC
  Administered 2019-02-25: 14:00:00 via INTRAVENOUS
  Filled 2019-02-25: qty 250

## 2019-02-26 DIAGNOSIS — H93299 Other abnormal auditory perceptions, unspecified ear: Secondary | ICD-10-CM | POA: Diagnosis not present

## 2019-05-16 ENCOUNTER — Other Ambulatory Visit: Payer: Self-pay | Admitting: *Deleted

## 2019-05-16 DIAGNOSIS — D500 Iron deficiency anemia secondary to blood loss (chronic): Secondary | ICD-10-CM

## 2019-05-17 NOTE — Progress Notes (Unsigned)
[place]  Telephone:(336) [phone] [phone]) [phone]  ID: Suzanne Foster OB: [birth year]  MR#: [id number]  AOZ#:[id number]  Patient Care Team: Patient, No Pcp Per as PCP - General (General Practice) [name], MD as Consulting Physician (Cardiology)  CHIEF COMPLAINT: Iron deficiency anemia due to chronic blood loss.  INTERVAL HISTORY: Patient returns to clinic today for repeat laboratory work, further evaluation, and continuation of IV Feraheme.  She continues to have chronic weakness and fatigue that has been getting worse over the past several weeks, but she remains active and works full-time. She has no neurologic complaints.  She denies any recent fevers or illnesses.  She has a good appetite and denies weight loss.  She denies any chest pain, shortness of breath, hemoptysis, or cough.  She has no nausea, vomiting, constipation, or diarrhea.  She has no melena or hematochezia.  She has no urinary complaints.  Patient offers no further specific complaints today.  REVIEW OF SYSTEMS:   Review of Systems  Constitutional: Positive for malaise/fatigue. Negative for fever and weight loss.  Respiratory: Negative.  Negative for cough, hemoptysis and shortness of breath.   Cardiovascular: Negative.  Negative for chest pain and leg swelling.  Gastrointestinal: Negative.  Negative for abdominal pain, blood in stool and melena.  Genitourinary: Negative.  Negative for hematuria.  Musculoskeletal: Negative.  Negative for back pain.  Skin: Negative.  Negative for rash.  Neurological: Positive for weakness. Negative for focal weakness and headaches.  Psychiatric/Behavioral: Negative.  The patient is not nervous/anxious.     As per HPI. Otherwise, a complete review of systems is negative.  PAST MEDICAL HISTORY: Past Medical History:  Diagnosis Date  . DDD (degenerative disc disease), lumbar   . Depression   . IBS (irritable bowel syndrome)   . Infertility, female    . Lactose intolerance   . Mitral valve prolapse    dx age 39, had echo done 10 years ago in [facility]  . Uterine fibroid     PAST SURGICAL HISTORY: Past Surgical History:  Procedure Laterality Date  . BALLOON DILATION N/A 08/02/2016   Procedure: BALLOON DILATION;  Surgeon: Arta Silence, MD;  Location: [facility];  Service: Endoscopy;  Laterality: N/A;  . CESAREAN SECTION     x1   . ESOPHAGOGASTRODUODENOSCOPY (EGD) WITH PROPOFOL N/A 08/02/2016   Procedure: ESOPHAGOGASTRODUODENOSCOPY (EGD) WITH PROPOFOL;  Surgeon: Arta Silence, MD;  Location: [facility];  Service: Endoscopy;  Laterality: N/A;  . LAPAROSCOPY     x 2 for endometriosis, and ovarian cysts  . WISDOM TOOTH EXTRACTION      FAMILY HISTORY: Family History  Problem Relation Age of Onset  . Atrial fibrillation Mother   . Atrial fibrillation Father        CHF, post ablative  . Breast cancer Paternal Grandmother   . Uterine cancer Paternal Grandmother   . Thyroid cancer Paternal Grandmother     ADVANCED DIRECTIVES (Y/N):  N  HEALTH MAINTENANCE: Social History   Tobacco Use  . Smoking status: Never Smoker  . Smokeless tobacco: Never Used  Substance Use Topics  . Alcohol use: Yes    Comment: 1 every 6 months  . Drug use: No     Colonoscopy:  PAP:  Bone density:  Lipid panel:  No Known Allergies  Current Outpatient Medications  Medication Sig Dispense Refill  . atenolol (TENORMIN) 25 MG tablet Take 1 tablet (25 mg total) by mouth every evening. 90 tablet 4  . buPROPion (WELLBUTRIN XL) 150  MG 24 hr tablet Take 1 tablet (150 mg total) by mouth 3 (three) times daily. 270 tablet 3  . escitalopram (LEXAPRO) 20 MG tablet Take 1 tablet (20 mg total) by mouth daily. 90 tablet 3  . ibuprofen (ADVIL,MOTRIN) 200 MG tablet Take 600 mg by mouth 2 (two) times daily as needed for headache or moderate pain.    [name] LORazepam (ATIVAN) 0.5 MG tablet TAKE 1/2 TO 1 TABLET BY MOUTH EVERY 8 HOURS AS NEEDED FOR ANXIETY 30 tablet 1   . propranolol (INDERAL) 20 MG tablet Take 1 tablet (20 mg total) by mouth 3 (three) times daily as needed. 90 tablet 6  . traZODone (DESYREL) 50 MG tablet Take 1 tablet (50 mg total) by mouth at bedtime as needed for sleep. CAN TAKE 1-2 QHS PRN 180 tablet 3   No current facility-administered medications for this visit.     OBJECTIVE: There were no vitals filed for this visit.   There is no height or weight on file to calculate BMI.    ECOG FS:0 - Asymptomatic  General: Well-developed, well-nourished, no acute distress. Eyes: Pink conjunctiva, anicteric sclera. HEENT: Normocephalic, moist mucous membranes. Lungs: Clear to auscultation bilaterally. Heart: Regular rate and rhythm. No rubs, murmurs, or gallops. Abdomen: Soft, nontender, nondistended. No organomegaly noted, normoactive bowel sounds. Musculoskeletal: No edema, cyanosis, or clubbing. Neuro: Alert, answering all questions appropriately. Cranial nerves grossly intact. Skin: No rashes or petechiae noted. Psych: Normal affect.  LAB RESULTS:  Lab Results  Component Value Date   NA 138 06/07/2012   K 4.1 06/07/2012   CL 104 06/07/2012   CO2 25 06/07/2012   GLUCOSE 114 (H) 06/07/2012   BUN 13 06/07/2012   CREATININE 0.9 06/07/2012   CALCIUM 9.2 06/07/2012   PROT 7.2 06/07/2012   ALBUMIN 4.0 06/07/2012   AST 20 06/07/2012   ALT 13 06/07/2012   ALKPHOS 44 06/07/2012   BILITOT 0.5 06/07/2012    Lab Results  Component Value Date   WBC 8.1 02/17/2019   NEUTROABS 5.5 02/17/2019   HGB 10.7 (L) 02/17/2019   HCT 34.4 (L) 02/17/2019   MCV 77.0 (L) 02/17/2019   PLT 308 02/17/2019   Lab Results  Component Value Date   IRON 34 02/17/2019   TIBC 561 (H) 02/17/2019   IRONPCTSAT 6 (L) 02/17/2019   Lab Results  Component Value Date   FERRITIN 6 (L) 02/17/2019     STUDIES: No results found.  ASSESSMENT: Iron deficiency anemia due to chronic blood loss.  PLAN:    1. Iron deficiency anemia due to chronic blood  loss: Secondary to patient's heavy menses.  Patient's hemoglobin and iron stores have trended down and she is symptomatic.  Proceed with 510 mg IV Feraheme today.  Return to clinic in 1 week for second infusion.  Patient will then return to clinic in 3 months with repeat laboratory work and further evaluation.   2.  Heavy menses: Continue evaluation and treatment per gynecology.  I spent a total of 30 minutes face-to-face with the patient of which greater than 50% of the visit was spent in counseling and coordination of care as detailed above.  Patient expressed understanding and was in agreement with this plan. She also understands that She can call clinic at any time with any questions, concerns, or complaints.    Lloyd Huger, MD   05/17/2019 7:33 AM

## 2019-05-20 ENCOUNTER — Inpatient Hospital Stay: Payer: Self-pay | Admitting: Oncology

## 2019-05-20 ENCOUNTER — Inpatient Hospital Stay: Payer: Self-pay

## 2019-05-24 IMAGING — MR MR CERVICAL SPINE W/O CM
5 series · 33 of 48 positions shown · non-contrast
Comparison: None.

CLINICAL DATA: Left-sided neck pain for 6 months

EXAM:
MRI CERVICAL SPINE WITHOUT CONTRAST
TECHNIQUE: Multiplanar, multisequence MR imaging of the cervical spine was
performed. No intravenous contrast was administered.

[Series 3: T2 · sagittal · 3.0mm · 0.56mm/px · 6 of 13 slices shown (1 of 2)]
[im 1/13]
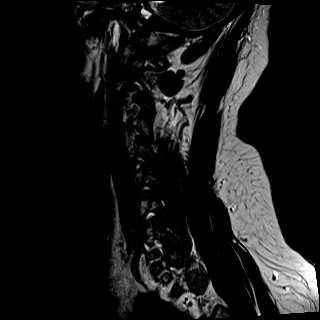
[im 3/13]
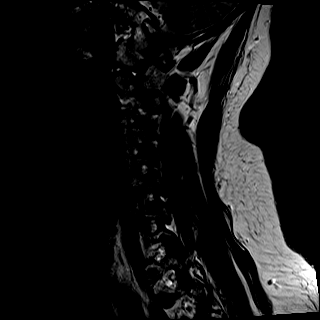
[im 5/13]
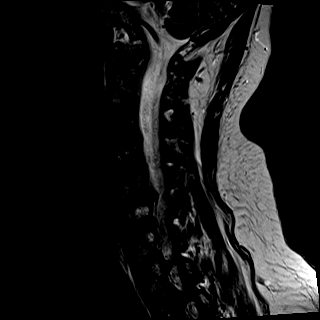
[im 8/13]
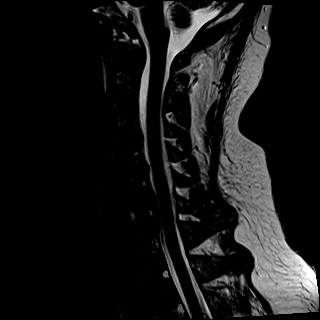
[im 10/13]
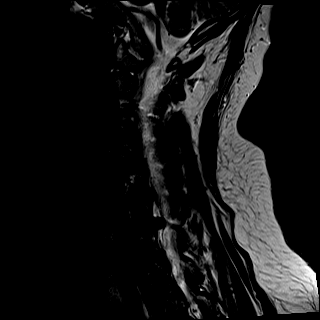
[im 13/13]
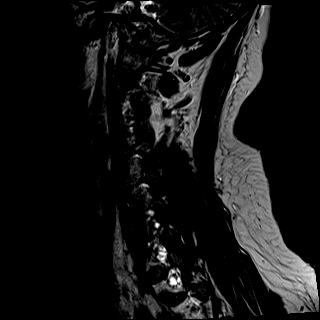

[Series 4: T1 · sagittal · 3.0mm · 0.70mm/px · 7 of 13 slices shown]
[im 1/13]
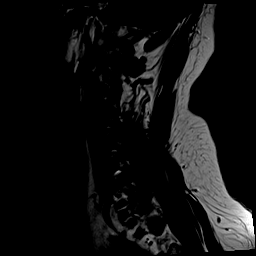
[im 3/13]
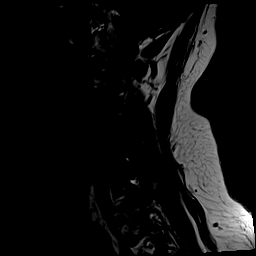
[im 5/13]
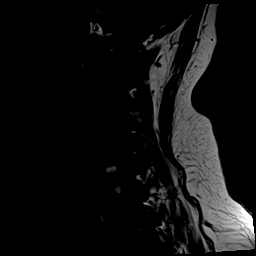
[im 7/13]
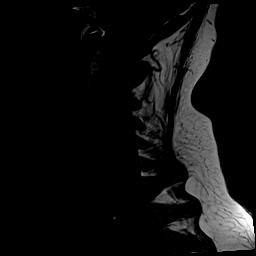
[im 9/13]
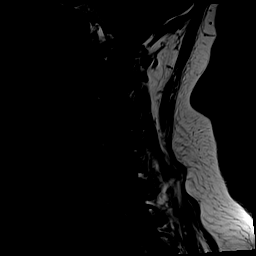
[im 11/13]
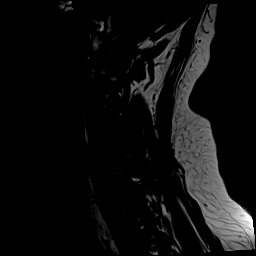
[im 13/13]
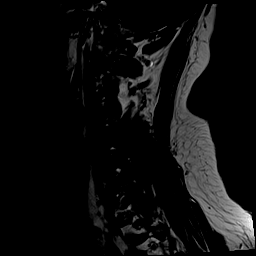

[Series 5: STIR · sagittal · 3.0mm · 0.35mm/px · 7 of 13 slices shown]
[im 1/13]
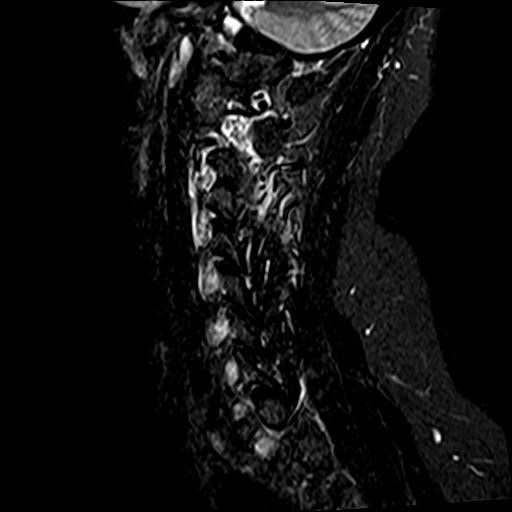
[im 3/13]
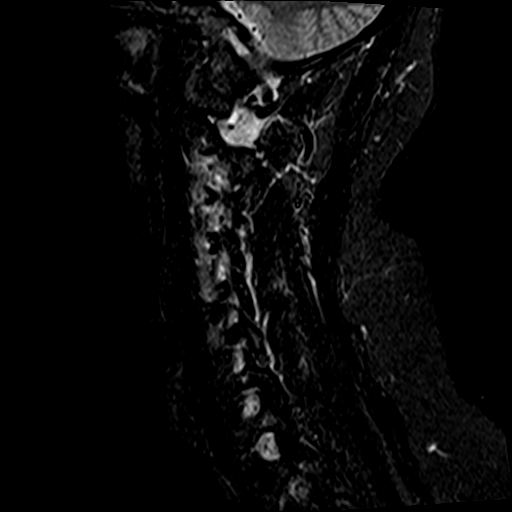
[im 5/13]
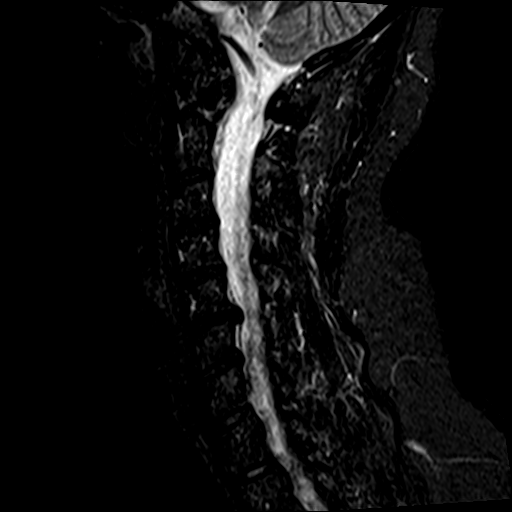
[im 7/13]
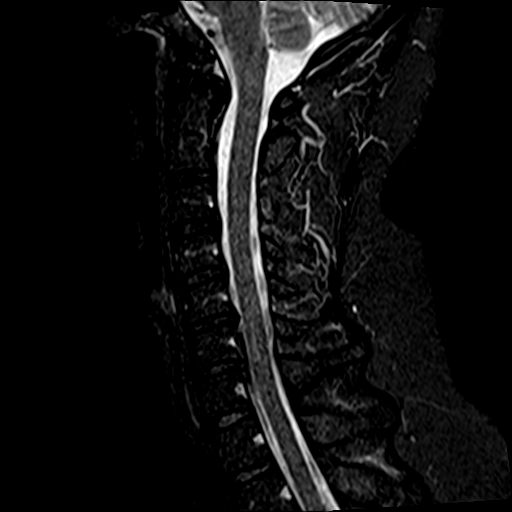
[im 9/13]
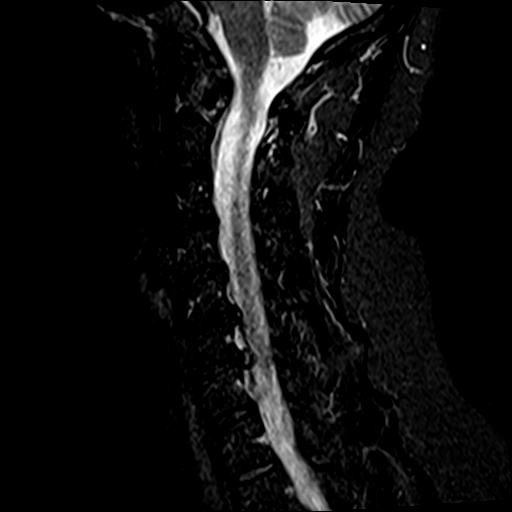
[im 11/13]
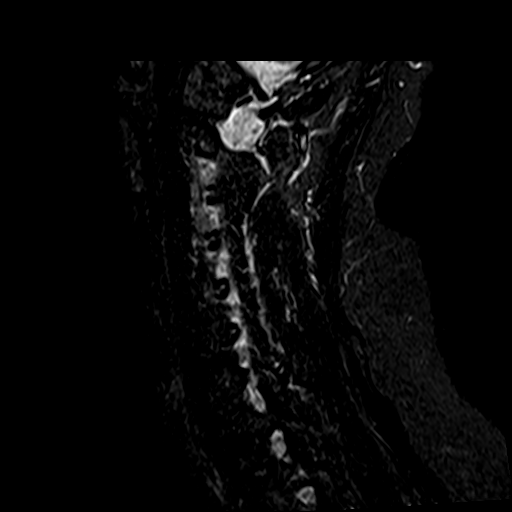
[im 13/13]
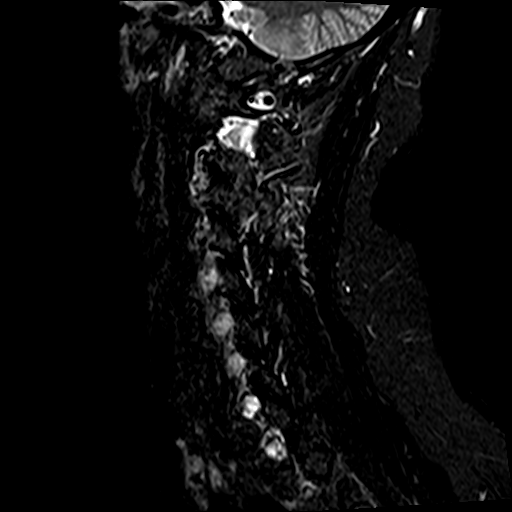

[Series 6: T2 · axial · 3.0mm · 0.62mm/px · z∈[-275,-177]mm · 8 of 27 slices shown (2 of 2)]
[im 1/27]
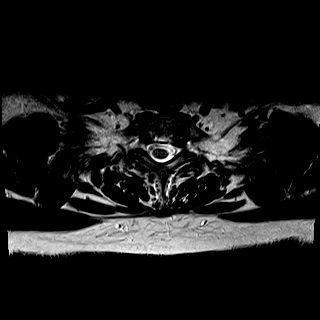
[im 5/27]
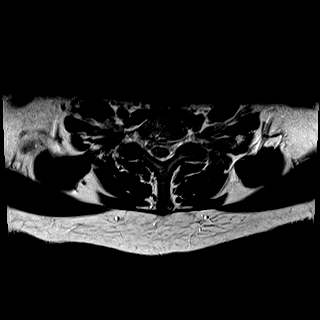
[im 9/27]
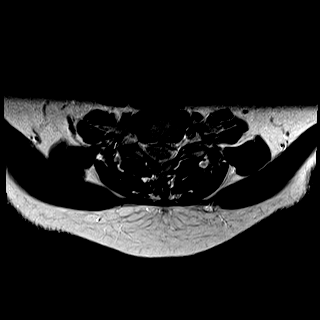
[im 13/27]
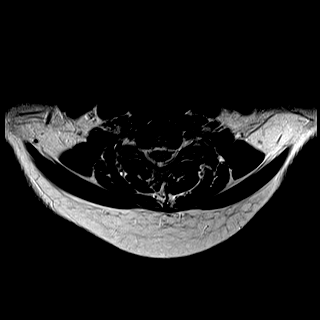
[im 15/27]
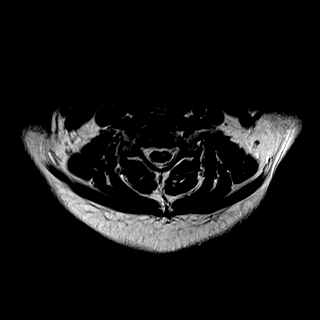
[im 19/27]
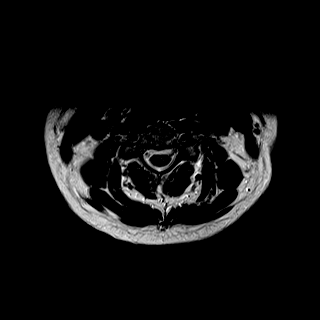
[im 23/27]
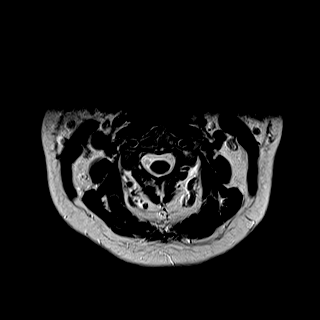
[im 27/27]
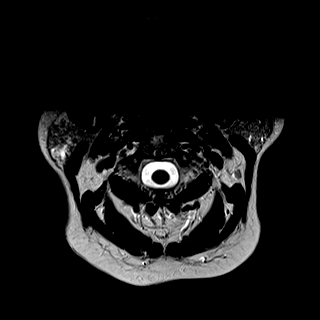

[Series 7: mpgr ax · axial · 3.0mm · 0.35mm/px · z∈[-266,-213]mm · 5 of 27 slices shown]
[im 1/27]
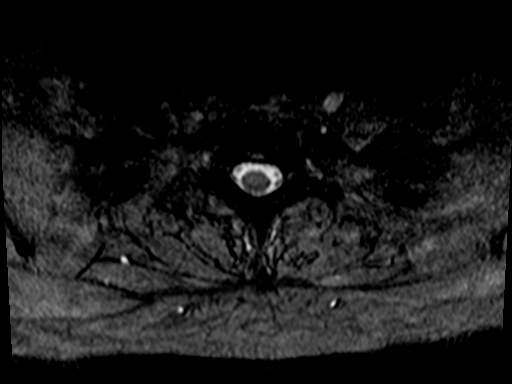
[im 5/27]
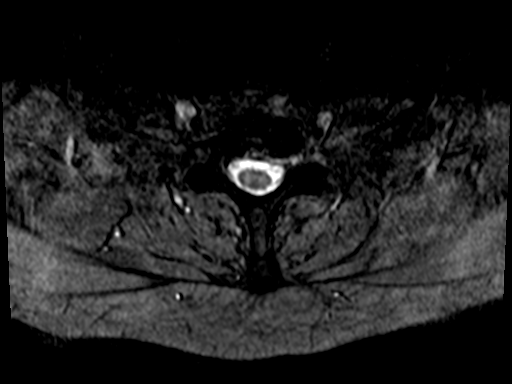
[im 9/27]
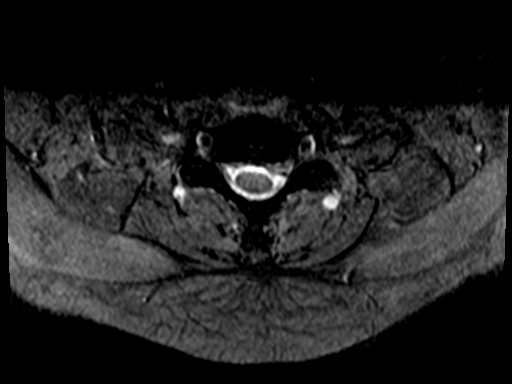
[im 13/27]
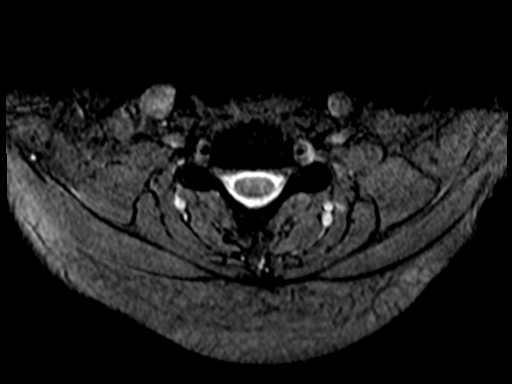
[im 15/27]
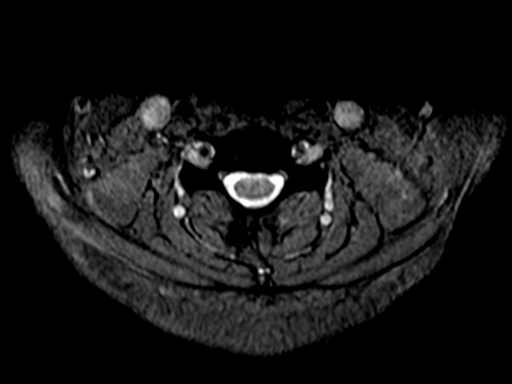

[33 of 48 positions shown; findings below may reference images not displayed]

FINDINGS: Alignment: Physiologic.

Vertebrae: No fracture, evidence of discitis, or bone lesion.

Cord: Normal signal and morphology.

Posterior Fossa, vertebral arteries, paraspinal tissues: Posterior
fossa demonstrates no focal abnormality. Vertebral artery flow voids
are maintained. Paraspinal soft tissues are unremarkable.

Disc levels:

Discs: Degenerative disc disease with disc height loss at C5-6 and
C6-7.

C2-3: No significant disc bulge. No neural foraminal stenosis. No
central canal stenosis.

C3-4: Minimal broad-based disc bulge. No neural foraminal stenosis.
No central canal stenosis.

C4-5: Minimal broad-based disc bulge. No neural foraminal stenosis.
No central canal stenosis.

C5-6: Broad-based disc bulge with prominent bilateral foraminal
components. Moderate left and severe right foraminal stenosis. Mild
spinal stenosis.

C6-7: Broad-based disc bulge with a left paracentral disc
protrusion. Mild right foraminal stenosis. Mild left foraminal
stenosis. No central canal stenosis.

C7-T1: No significant disc bulge. No neural foraminal stenosis. No
central canal stenosis.
IMPRESSION: 1. At C6-7 there is a broad-based disc bulge with a left paracentral
disc protrusion. Mild right foraminal stenosis. Mild left foraminal
stenosis.
2. At C5-6 there is a broad-based disc bulge with prominent
bilateral foraminal components. Moderate left and severe right
foraminal stenosis. Mild spinal stenosis.

## 2019-07-10 ENCOUNTER — Other Ambulatory Visit: Payer: Self-pay

## 2019-07-10 ENCOUNTER — Telehealth: Payer: Self-pay | Admitting: Physician Assistant

## 2019-07-10 MED ORDER — BUPROPION HCL ER (XL) 150 MG PO TB24: 150.0000 mg | ORAL_TABLET | Freq: Three times a day (TID) | ORAL | 0 refills | Status: DC

## 2019-07-10 NOTE — Telephone Encounter (Signed)
Pt called to request refill for Wellbutrin @ new pharmacy [place]    [phone] All meds to this pharmacy on going. Appt 10/27. Will run out 10/25

## 2019-07-10 NOTE — Telephone Encounter (Signed)
Refill submitted and pharmacy updated in system

## 2019-07-14 ENCOUNTER — Telehealth: Payer: Self-pay | Admitting: Physician Assistant

## 2019-07-14 NOTE — Telephone Encounter (Signed)
[facility] out pt pharmacy called to inquire about the recent RX for Suzanne Foster's bupropion.  They say usually it has been 1-2/day, but this one is for 3/day.  They are wanting verification that it is a change to 3/day. Please call to verify - [phone].

## 2019-07-14 NOTE — Telephone Encounter (Signed)
Left patient voicemail to call back with her correct dosage of Wellbutrin.

## 2019-07-15 ENCOUNTER — Telehealth: Payer: Self-pay | Admitting: Physician Assistant

## 2019-07-15 ENCOUNTER — Other Ambulatory Visit: Payer: Self-pay

## 2019-07-15 ENCOUNTER — Ambulatory Visit: Payer: 59 | Admitting: Physician Assistant

## 2019-07-15 MED ORDER — TRAZODONE HCL 50 MG PO TABS: 50.0000 mg | ORAL_TABLET | Freq: Every evening | ORAL | 3 refills | Status: DC | PRN

## 2019-07-15 NOTE — Telephone Encounter (Signed)
Left message to call about correct dose of Wellbutrin on v-mail [phone].

## 2019-07-15 NOTE — Telephone Encounter (Signed)
Trazodone refill submitted, will wait for patient to call back on dose of her Wellbutrin

## 2019-07-15 NOTE — Telephone Encounter (Signed)
Pt requested refill for Trazodone @ [facility]. Appt 11/18

## 2019-07-16 ENCOUNTER — Other Ambulatory Visit: Payer: Self-pay

## 2019-07-16 MED ORDER — BUPROPION HCL ER (XL) 150 MG PO TB24: 450.0000 mg | ORAL_TABLET | Freq: Every day | ORAL | 0 refills | Status: DC

## 2019-07-16 NOTE — Telephone Encounter (Signed)
Patient called back to confirm takes 3/day of Wellbutrin XL 150 mg, pharmacy notified

## 2019-07-16 NOTE — Telephone Encounter (Signed)
Patient called back to confirm she's taking 3/day of Wellbutrin XL. Will notify pharmacy correct dose was submitted

## 2019-07-23 MED FILL — METHYLPREDNISOLONE 4 MG TBP: 4 | 6 days supply | Qty: 21 | Fill #0

## 2019-08-01 ENCOUNTER — Telehealth: Payer: Self-pay | Admitting: Physician Assistant

## 2019-08-01 ENCOUNTER — Other Ambulatory Visit: Payer: Self-pay

## 2019-08-01 MED ORDER — TRAZODONE HCL 50 MG PO TABS: ORAL_TABLET | ORAL | 3 refills | Status: DC

## 2019-08-01 NOTE — Telephone Encounter (Signed)
Pt needs refill on her Trazodone. Please sent to [facility].

## 2019-08-01 NOTE — Telephone Encounter (Signed)
Refill for 90 day supply submitted on 07/15/2019 for [place], will resubmit again.

## 2019-08-06 ENCOUNTER — Ambulatory Visit: Payer: PRIVATE HEALTH INSURANCE | Admitting: Physician Assistant

## 2019-08-21 ENCOUNTER — Other Ambulatory Visit: Payer: Self-pay | Admitting: Physician Assistant

## 2019-09-04 ENCOUNTER — Ambulatory Visit (INDEPENDENT_AMBULATORY_CARE_PROVIDER_SITE_OTHER): Payer: PRIVATE HEALTH INSURANCE | Admitting: Physician Assistant

## 2019-09-04 ENCOUNTER — Encounter: Payer: Self-pay | Admitting: Physician Assistant

## 2019-09-04 DIAGNOSIS — G4700 Insomnia, unspecified: Secondary | ICD-10-CM

## 2019-09-04 DIAGNOSIS — F411 Generalized anxiety disorder: Secondary | ICD-10-CM

## 2019-09-04 DIAGNOSIS — F4321 Adjustment disorder with depressed mood: Secondary | ICD-10-CM | POA: Diagnosis not present

## 2019-09-04 MED ORDER — LORAZEPAM 0.5 MG PO TABS: ORAL_TABLET | ORAL | 1 refills | Status: DC

## 2019-09-04 MED ORDER — ESCITALOPRAM OXALATE 20 MG PO TABS: 30.0000 mg | ORAL_TABLET | Freq: Every day | ORAL | 0 refills | Status: DC

## 2019-09-04 NOTE — Progress Notes (Signed)
[facility] Med Check  Patient ID: Suzanne Foster,  MRN: [id number]  PCP: Patient, No Pcp Per  Date of Evaluation: 09/04/2019 Time spent:15 minutes  Chief Complaint:  Chief Complaint    Depression; Anxiety; Follow-up     Virtual Visit via Telephone Note  I connected with patient by a video enabled telemedicine application or telephone, with their informed consent, and verified patient privacy and that I am speaking with the correct person using two identifiers.  I am private, in my office and the patient is in her car.   I discussed the limitations, risks, security and privacy concerns of performing an evaluation and management service by telephone and the availability of in person appointments. I also discussed with the patient that there may be a patient responsible charge related to this service. The patient expressed understanding and agreed to proceed.   I discussed the assessment and treatment plan with the patient. The patient was provided an opportunity to ask questions and all were answered. The patient agreed with the plan and demonstrated an understanding of the instructions.   The patient was advised to call back or seek an in-person evaluation if the symptoms worsen or if the condition fails to improve as anticipated.  I provided 15  minutes of non-face-to-face time during this encounter.  HISTORY/CURRENT STATUS: HPI For routine med check.   Tamorah has had a lot of changes in the past few months.  She has changed jobs and is now at [employer].  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  Her father is in the hospital in [place] and is not doing well.  She is unable to go and be with him because of all the COVID restrictions.  "I have just got a lot going on and it stressful."  She is able to go to work but by the weekend, states she crashes and has no more energy or motivation to do  anything.  She gets tearful sometimes but is usually able to suppress that and internalize it.  She does not feel like doing much of anything so not enjoying things.  She sleeps okay.  Denies suicidal or homicidal thoughts.  "I know a lot of this is situational but I am wondering if we need to make changes with the medication."  Anxiety has been more of an issue and she has needed more Ativan in the past month.  She still does not take it very often though.  Last fill was April 07, 2019.  Denies dizziness, syncope, seizures, numbness, tingling, tremor, tics, unsteady gait, slurred speech, confusion. Denies muscle or joint pain, stiffness, or dystonia.  Individual Medical History/ Review of Systems: Changes? :No    Past medications for mental health diagnoses include: Prozac, Ativan, Trazodone, Wellbutrin XL, Lexapro  Allergies: Patient has no known allergies.  Current Medications:  Current Outpatient Medications:  .  atenolol (TENORMIN) 25 MG tablet, Take 1 tablet (25 mg total) by mouth every evening., Disp: 90 tablet, Rfl: 4 .  buPROPion (WELLBUTRIN XL) 150 MG 24 hr tablet, Take 3 tablets (450 mg total) by mouth daily., Disp: 270 tablet, Rfl: 0 .  escitalopram (LEXAPRO) 20 MG tablet, Take 1.5 tablets (30 mg total) by mouth daily., Disp: 90 tablet, Rfl: 0 .  ibuprofen (ADVIL,MOTRIN) 200 MG tablet, Take 600 mg by mouth 2 (two) times daily as needed for headache or moderate pain., Disp: ,  Rfl:  .  LORazepam (ATIVAN) 0.5 MG tablet, TAKE 1/2 TO 1 TABLET BY MOUTH EVERY 8 HOURS AS NEEDED FOR ANXIETY, Disp: 30 tablet, Rfl: 1 .  phentermine (ADIPEX-P) 37.5 MG tablet, Take 1/2 tab po BID, Disp: , Rfl:  .  propranolol (INDERAL) 20 MG tablet, Take 1 tablet (20 mg total) by mouth 3 (three) times daily as needed., Disp: 90 tablet, Rfl: 6 .  traZODone (DESYREL) 50 MG tablet, CAN TAKE 1-2 QHS PRN, Disp: 180 tablet, Rfl: 3 Medication Side Effects: none  Family Medical/ Social History: Changes? 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  General Appearance: unable to assess  Eye Contact:  unable to assess  Speech:  Clear and Coherent  Volume:  Normal  Mood:  Euthymic  Affect:  unable to assess  Thought Process:  Goal Directed and Descriptions of Associations: Intact  Orientation:  Full (Time, Place, and Person)  Thought Content: Logical   Suicidal Thoughts:  No  Homicidal Thoughts:  No  Memory:  WNL  Judgement:  Good  Insight:  Good  Psychomotor Activity:  unable to assess  Concentration:  Concentration: Good  Recall:  Good  Fund of Knowledge: Good  Language: Good  Assets:  Desire for Improvement  ADL's:  Intact  Cognition: WNL  Prognosis:  Good    DIAGNOSES:    ICD-10-CM   1. Situational depression  F43.21   2. Generalized anxiety disorder  F41.1   3. Insomnia, unspecified type  G47.00     Receiving Psychotherapy: No    RECOMMENDATIONS:  I agree that part of what she is experiencing is due to current stressors.  But I think increasing the Lexapro would be a good idea right now.  We discussed other options but right here at the holidays I prefer not to change medications like going to an SNRI for example.  Since we know she has responded well to the Lexapro and the Wellbutrin XL we will continue that regimen but increase the Lexapro.  She is in agreement. Increase Lexapro 20 mg to 1-1/2 pills daily. Continue Wellbutrin XL 150 mg, 3 p.o. daily. Continue Ativan 0.5 mg, 1/2-1 p.o. every 8 hours as needed anxiety. Continue propranolol 20 mg 3 times daily as needed. Continue trazodone 50 mg, 1-2 p.o. nightly as needed sleep. Return in 4 to 6 weeks.  Donnal Moat, PA-C

## 2019-09-23 ENCOUNTER — Ambulatory Visit: Payer: PRIVATE HEALTH INSURANCE | Attending: Internal Medicine

## 2019-09-23 DIAGNOSIS — Z20822 Contact with and (suspected) exposure to covid-19: Secondary | ICD-10-CM

## 2019-09-25 LAB — NOVEL CORONAVIRUS, NAA: SARS-CoV-2, NAA: NOT DETECTED

## 2019-10-06 ENCOUNTER — Other Ambulatory Visit: Payer: Self-pay | Admitting: Cardiovascular Disease

## 2019-11-13 ENCOUNTER — Other Ambulatory Visit: Payer: Self-pay | Admitting: Physician Assistant

## 2019-11-13 ENCOUNTER — Telehealth: Payer: Self-pay | Admitting: Physician Assistant

## 2019-11-13 MED ORDER — ESCITALOPRAM OXALATE 20 MG PO TABS: 30.0000 mg | ORAL_TABLET | Freq: Every day | ORAL | 1 refills | Status: DC

## 2019-11-13 NOTE — Telephone Encounter (Signed)
Suzanne Foster called to request refill of her Lexapro at the new dose.  Appt 11/28/19.  Send to [facility]

## 2019-11-13 NOTE — Telephone Encounter (Signed)
Rx sent in

## 2019-11-28 ENCOUNTER — Encounter: Payer: Self-pay | Admitting: Physician Assistant

## 2019-11-28 ENCOUNTER — Ambulatory Visit (INDEPENDENT_AMBULATORY_CARE_PROVIDER_SITE_OTHER): Payer: PRIVATE HEALTH INSURANCE | Admitting: Physician Assistant

## 2019-11-28 DIAGNOSIS — F4321 Adjustment disorder with depressed mood: Secondary | ICD-10-CM | POA: Diagnosis not present

## 2019-11-28 DIAGNOSIS — F411 Generalized anxiety disorder: Secondary | ICD-10-CM | POA: Diagnosis not present

## 2019-11-28 DIAGNOSIS — G4700 Insomnia, unspecified: Secondary | ICD-10-CM | POA: Diagnosis not present

## 2019-11-28 MED ORDER — BUPROPION HCL ER (XL) 150 MG PO TB24: 450.0000 mg | ORAL_TABLET | Freq: Every day | ORAL | 3 refills | Status: DC

## 2019-11-28 NOTE — Progress Notes (Signed)
[facility]  Patient ID: Suzanne Foster,  MRN: [id number]  PCP: Patient, No Pcp Per  Date of Evaluation: 11/28/2019 Time spent:20 minutes  Chief Complaint:  Chief Complaint    Depression     Virtual Visit via Telephone Note  I connected with patient by a video enabled telemedicine application or telephone, with their informed consent, and verified patient privacy and that I am speaking with the correct person using two identifiers.  I am private, in my office and the patient is at work.  I discussed the limitations, risks, security and privacy concerns of performing an evaluation and management service by telephone and the availability of in person appointments. I also discussed with the patient that there may be a patient responsible charge related to this service. The patient expressed understanding and agreed to proceed.   I discussed the assessment and treatment plan with the patient. The patient was provided an opportunity to ask questions and all were answered. The patient agreed with the plan and demonstrated an understanding of the instructions.   The patient was advised to call back or seek an in-person evaluation if the symptoms worsen or if the condition fails to improve as anticipated.  I provided 20 minutes of non-face-to-face time during this encounter.  HISTORY/CURRENT STATUS: HPI for routine med check.  Her Dad passed away in 10/28/2022.  That's been hard, but "I think I am handling it as well as anybody could when you lose a parent."  She does have her "down times" when she feels really sad.  She prefers not to change any of the medications right now as it is not a good time.  Energy and motivation are pretty good.  Work is going well.  Within the past year, she changed hospital systems and is now at [facility] and things are going well, it is just that it is still kind of new, so that has been some added stress.  She cries at times but feels that that  is normal grief.  Denies suicidal or homicidal thoughts.  She still not sleeping very well.  If she takes the trazodone, 25 mg, even that puts her to sleep to the point that she would not hear the telephone.  She does not want to be in that deep of the sleep.  She has a teenage son and wants to be able to wake up if needed.  She also feels kind of groggy in the next day.  She does still have some anxiety but it is usually something she can work through.  She rarely takes the Ativan.  It is effective when she does take it.  There is a change in her mood, over the past few months, even before her father passed away.  She describes it as a very sudden, under triggered change where she may suddenly become irritable, or more depressed.  It might last a few hours or day or so.  She is not sure what that is about but just wanted me to know about it.  Denies increased energy with decreased need for sleep.  No impulsivity or risky behavior.  No increased libido or spending.  No grandiosity.  No hallucinations.  Denies dizziness, syncope, seizures, numbness, tingling, tremor, tics, unsteady gait, slurred speech, confusion. Denies muscle or joint pain, stiffness, or dystonia.  Individual Medical History/ Review of Systems: Changes? :No    Past medications for mental health diagnoses include: Prozac, Ativan, Trazodone, Wellbutrin XL, Lexapro  Allergies:  Patient has no known allergies.  Current Medications:  Current Outpatient Medications:  .  buPROPion (WELLBUTRIN XL) 150 MG 24 hr tablet, Take 3 tablets (450 mg total) by mouth daily., Disp: 270 tablet, Rfl: 3 .  escitalopram (LEXAPRO) 20 MG tablet, Take 1.5 tablets (30 mg total) by mouth daily., Disp: 135 tablet, Rfl: 1 .  ibuprofen (ADVIL,MOTRIN) 200 MG tablet, Take 600 mg by mouth 2 (two) times daily as needed for headache or moderate pain., Disp: , Rfl:  .  LORazepam (ATIVAN) 0.5 MG tablet, TAKE 1/2 TO 1 TABLET BY MOUTH EVERY 8 HOURS AS NEEDED FOR  ANXIETY, Disp: 30 tablet, Rfl: 1 .  phentermine (ADIPEX-P) 37.5 MG tablet, Take 1/2 tab po BID, Disp: , Rfl:  .  propranolol (INDERAL) 20 MG tablet, Take 1 tablet (20 mg total) by mouth 3 (three) times daily as needed., Disp: 90 tablet, Rfl: 6 .  traZODone (DESYREL) 50 MG tablet, CAN TAKE 1-2 QHS PRN, Disp: 180 tablet, Rfl: 3 .  atenolol (TENORMIN) 25 MG tablet, Take 1 tablet (25 mg total) by mouth every evening. Please schedule office visit for further refills. Thank you! (Patient not taking: Reported on 11/28/2019), Disp: 30 tablet, Rfl: 0 Medication Side Effects: none  Family Medical/ Social History: Changes? Yes Her Dad died in Oct 25, 2022.  MENTAL HEALTH EXAM:  There were no vitals taken for this visit.There is no height or weight on file to calculate BMI.  General Appearance: Unable to assess  Eye Contact:  Unable to assess  Speech:  Clear and Coherent  Volume:  Normal  Mood:  Euthymic  Affect:  Unable to assess  Thought Process:  Goal Directed and Descriptions of Associations: Intact  Orientation:  Full (Time, Place, and Person)  Thought Content: Logical   Suicidal Thoughts:  No  Homicidal Thoughts:  No  Memory:  WNL  Judgement:  Good  Insight:  Good  Psychomotor Activity:  Unable to assess  Concentration:  Concentration: Good  Recall:  Good  Fund of Knowledge: Good  Language: Good  Assets:  Desire for Improvement  ADL's:  Intact  Cognition: WNL  Prognosis:  Good    DIAGNOSES:    ICD-10-CM   1. Situational depression  F43.21   2. Generalized anxiety disorder  F41.1   3. Insomnia, unspecified type  G47.00     Receiving Psychotherapy: No    RECOMMENDATIONS:  PDMP was reviewed. I spent 20 minutes with her. My condolences for the death of her father. I agree that we should leave all meds the same for now.  If anything changes, she can call and let me know.  Specifically if the rapid change in mood becomes more severe or occurs more often.  I think that is most likely  related to the stress, and grief but it does need to be observed to make sure there is no biochemical reason for it. Continue Wellbutrin XL 150 mg, 3 p.o. daily. Continue Lexapro 20 mg, 1.5 pills daily. Continue Ativan 0.5 mg, 1/2-1 3 times daily as needed.  She might want to try this instead of the trazodone for sleep.  She may not have as much of a hangover effect or go into as deep of a sleep so that she could not wake up if needed.  She will try it. Continue trazodone 50 mg, 1/2-1 p.o. nightly as needed.  She will also see if she can cut it into 1/4 to see if that will help. Consider counseling. Return in 2 months.  Donnal Moat, PA-C

## 2020-01-19 ENCOUNTER — Other Ambulatory Visit: Payer: Self-pay

## 2020-01-19 ENCOUNTER — Encounter: Payer: Self-pay | Admitting: Physician Assistant

## 2020-01-19 ENCOUNTER — Ambulatory Visit (INDEPENDENT_AMBULATORY_CARE_PROVIDER_SITE_OTHER): Payer: No Typology Code available for payment source | Admitting: Physician Assistant

## 2020-01-19 DIAGNOSIS — F331 Major depressive disorder, recurrent, moderate: Secondary | ICD-10-CM

## 2020-01-19 DIAGNOSIS — F411 Generalized anxiety disorder: Secondary | ICD-10-CM

## 2020-01-19 DIAGNOSIS — G4700 Insomnia, unspecified: Secondary | ICD-10-CM | POA: Diagnosis not present

## 2020-01-19 DIAGNOSIS — Z634 Disappearance and death of family member: Secondary | ICD-10-CM

## 2020-01-19 DIAGNOSIS — E663 Overweight: Secondary | ICD-10-CM

## 2020-01-19 MED ORDER — DULOXETINE HCL 30 MG PO CPEP: ORAL_CAPSULE | ORAL | 1 refills | Status: DC

## 2020-01-19 NOTE — Progress Notes (Signed)
[facility]  Patient ID: Suzanne Foster,  MRN: [id number]  PCP: Patient, No Pcp Per  Date of Evaluation: 01/19/2020 Time spent:30 minutes  Chief Complaint:  Chief Complaint    Anxiety; Depression      HISTORY/CURRENT STATUS: HPI Not doing well.  Things have been kind of rough. More depressed, but not missing work. "I put on a good show." Focus isn't as good as it was.  Not enjoying much, too tired. But does go out with friends on weekends.  Forces herself to do things, or else she'd "stay in the bed." Normal ADLs. Cries easily. Continues to grieve the death of her father, who passed away in 2022-11-03. No SI/HI.  Anxiety has increased too. Not having PA but feels overwhelmed and internal sense of unease at times.   Was put on phentermine recently for weight loss by a provider at the weight loss clinic at [facility].  She states it made her feel horrible so she stopped it.  She was having more depression on it than she was previously.  She is trying to watch her diet and exercise more in order to lose weight and be healthier.  Patient denies increased energy with decreased need for sleep, no increased talkativeness, no racing thoughts, no impulsivity or risky behaviors, no increased spending, no increased libido, no grandiosity, no increased irritability or anger, and no hallucinations.  Is having random joint and muscle pains. Occurring for awhile. Has strong Fm H/O RA. Plans to see her PCP about her pain soon and to r/o RA. Denies dizziness, syncope, seizures, numbness, tingling, tremor, tics, unsteady gait, slurred speech, confusion.   Individual Medical History/ Review of Systems: Changes? :No    Past medications for mental health diagnoses include: Prozac, Ativan, Trazodone, Wellbutrin XL, Lexapro  Allergies: Patient has no known allergies.  Current Medications:  Current Outpatient Medications:  .  buPROPion (WELLBUTRIN XL) 150 MG 24 hr tablet, Take 3 tablets (450 mg  total) by mouth daily., Disp: 270 tablet, Rfl: 3 .  ibuprofen (ADVIL,MOTRIN) 200 MG tablet, Take 600 mg by mouth 2 (two) times daily as needed for headache or moderate pain., Disp: , Rfl:  .  LORazepam (ATIVAN) 0.5 MG tablet, TAKE 1/2 TO 1 TABLET BY MOUTH EVERY 8 HOURS AS NEEDED FOR ANXIETY, Disp: 30 tablet, Rfl: 1 .  propranolol (INDERAL) 20 MG tablet, Take 1 tablet (20 mg total) by mouth 3 (three) times daily as needed., Disp: 90 tablet, Rfl: 6 .  traZODone (DESYREL) 50 MG tablet, CAN TAKE 1-2 QHS PRN, Disp: 180 tablet, Rfl: 3 .  atenolol (TENORMIN) 25 MG tablet, Take 1 tablet (25 mg total) by mouth every evening. Please schedule office visit for further refills. Thank you! (Patient not taking: Reported on 11/28/2019), Disp: 30 tablet, Rfl: 0 .  DULoxetine (CYMBALTA) 30 MG capsule, 1 po qam for 2 weeks, then 2 po qam., Disp: 60 capsule, Rfl: 1 .  phentermine (ADIPEX-P) 37.5 MG tablet, Take 1/2 tab po BID, Disp: , Rfl:  Medication Side Effects: none  Family Medical/ Social History: Changes? No  MENTAL HEALTH EXAM:  There were no vitals taken for this visit.There is no height or weight on file to calculate BMI.  General Appearance: Casual, Neat, Well Groomed and Obese  Eye Contact:  Good  Speech:  Clear and Coherent and Normal Rate  Volume:  Normal  Mood:  Depressed  Affect:  Depressed  Thought Process:  Goal Directed and Descriptions of Associations: Intact  Orientation:  Full (Time, Place, and Person)  Thought Content: Logical   Suicidal Thoughts:  No  Homicidal Thoughts:  No  Memory:  WNL  Judgement:  Good  Insight:  Good  Psychomotor Activity:  Normal  Concentration:  Concentration: Good  Recall:  Good  Fund of Knowledge: Good  Language: Good  Assets:  Desire for Improvement  ADL's:  Intact  Cognition: WNL  Prognosis:  Good    DIAGNOSES:    ICD-10-CM   1. Major depressive disorder, recurrent episode, moderate (HCC)  F33.1   2. Generalized anxiety disorder  F41.1   3.  Insomnia, unspecified type  G47.00   4. Bereavement  Z63.4   5. Overweight  E66.3     Receiving Psychotherapy: No    RECOMMENDATIONS:  PDMP reviewed I spent 30 minutes with her. We discussed different options for the depression.  We agree that some of what she is feeling is due to grieving and stressors at work.  But I do not think the SSRI class is helping her right now.  We agreed to change to an SNRI, particularly Cymbalta because of its antidepressant and anxiolytic effects, as well as analgesic effects.  We discussed the benefits, risks, side effects and she accepts. Briefly discussed her weight.  I agree with decreasing carbs and increasing exercise. Wean off Lexapro 20 mg as follows: Take 1 p.o. daily for 7 days, then 1/2 pill daily for 7 days and then stop.  At the same time start Cymbalta. Start Cymbalta 30 mg, 1 p.o. every morning for 2 weeks and then 2 p.o. every morning. Continue Ativan 0.5 mg, 1/2-1 p.o. 3 times daily as needed anxiety. Continue trazodone 50 mg, 1-2 p.o. nightly as needed sleep. Referred to Rinaldo Cloud, LCSW for therapy. Return 6 weeks.   Donnal Moat, PA-C

## 2020-01-28 ENCOUNTER — Ambulatory Visit: Payer: PRIVATE HEALTH INSURANCE | Admitting: Physician Assistant

## 2020-02-02 ENCOUNTER — Other Ambulatory Visit: Payer: Self-pay | Admitting: Physician Assistant

## 2020-02-03 NOTE — Telephone Encounter (Signed)
That was my mistake.  Sorry, I'll send it now.

## 2020-02-03 NOTE — Telephone Encounter (Signed)
I did not see this listed at last visit?

## 2020-02-26 ENCOUNTER — Ambulatory Visit (INDEPENDENT_AMBULATORY_CARE_PROVIDER_SITE_OTHER): Payer: No Typology Code available for payment source | Admitting: Psychiatry

## 2020-02-26 ENCOUNTER — Other Ambulatory Visit: Payer: Self-pay

## 2020-02-26 DIAGNOSIS — F331 Major depressive disorder, recurrent, moderate: Secondary | ICD-10-CM

## 2020-02-26 NOTE — Progress Notes (Signed)
[facility] Initial Adult Exam  Name: Suzanne Foster Date: 02/26/2020 MRN: [id number] DOB: Aug 18, 1971 PCP: Patient, No Pcp Per  Time spent: 60 minutes  4:00pm to 5:00pm   Guardian/Payee:  paitent  Paperwork requested:  No   Reason for Visit /Presenting Problem/Symptoms :  Anxiety, depression, tired, some tearfulness, overwhelmed, worry, Hx of post-partum depression.   (depression is the stronger symptom and "runs in my family" with no hx of suicide)  Mental Status Exam:   Appearance:   Well Groomed     Behavior:  Appropriate, Sharing and Motivated  Motor:  Normal  Speech/Language:   Normal Rate  Affect:  anxious, some depression  Mood:  anxious, depressed and I feel like I stay in a state of worry, and mid-life crisis  Thought process:  normal  Thought content:    WNL  Sensory/Perceptual disturbances:    WNL  Orientation:  oriented to person, place, time/date, situation, day of week, month of year and year  Attention:  Good  Concentration:  Good and Fair  Memory:  [place] of knowledge:   Good  Insight:    Good  Judgment:   Good  Impulse Control:  Good   Reported Symptoms:  See above symptoms.  Risk Assessment: Danger to Self:  No Self-injurious Behavior: No Danger to Others: No Duty to Warn:no Physical Aggression / Violence:No  Access to Firearms a concern: No  Gang Involvement:No  Patient / guardian was educated about steps to take if suicide or homicide risk level increases between visits: Patient denies any SI. While future psychiatric events cannot be accurately predicted, the patient does not currently require acute inpatient psychiatric care and does not currently meet [facility] involuntary commitment criteria.  Substance Abuse History: Current substance abuse: No     Past Psychiatric History:   Previous psychological history is significant for depression Outpatient Providers:[facility] for [place] History of Psych Hospitalization: No   Psychological Testing: n/a   Abuse History: Victim of No., n/a   Report needed: No. Victim of Neglect:No. Perpetrator of n/a  Witness / Exposure to Domestic Violence: No   Protective Services Involvement: No  Witness to [employer] Violence:  no  Family History:  Family History  Problem Relation Age of Onset  . Atrial fibrillation Mother   . Atrial fibrillation Father        CHF, post ablative  . Breast cancer Paternal Grandmother   . Uterine cancer Paternal Grandmother   . Thyroid cancer Paternal Grandmother     Living situation: the patient lives with their son who 40 yrs old and will be senior in [employer] this coming semester  Sexual Orientation:  Straight  Relationship Status: divorced (had "a mental breakdown 7 yrs ago, Bipolar) Name of spouse / other:7 yrs divorced             If a parent, number of children / ages:1 41 yr old son  Support Systems; friends parents  Museum/gallery curator Stress:  sometimes  Income/Employment/Disability: Employment, no child support from son's dad  Armed forces logistics/support/administrative officer: No   Educational History: Education: post Forensic psychologist work or degree  Religion/Sprituality/World View:   n/a  Any cultural differences that may affect / interfere with treatment:  not applicable   Recreation/Hobbies: did not list  Stressors:Educational concerns Financial difficulties Loss of father died in 11/01/2019 of heart failure  Strengths:  Supportive Relationships, Family and career, health is good, resilient  Barriers:  life circumstances, I am my own worst critic  Legal History: Pending legal issue / charges: no. History of legal issue / charges: no  Medical History/Surgical History:Reviewed with Patient and info below is correct. Past Medical History:  Diagnosis Date  . DDD (degenerative disc disease), lumbar   . Depression   . IBS (irritable bowel syndrome)   . Infertility, female   . Lactose intolerance   . Mitral valve prolapse    dx age 59, had  echo done 10 years ago in [facility]  . Uterine fibroid     Past Surgical History:  Procedure Laterality Date  . BALLOON DILATION N/A 08/02/2016   Procedure: BALLOON DILATION;  Surgeon: Arta Silence, MD;  Location: [facility];  Service: Endoscopy;  Laterality: N/A;  . CESAREAN SECTION     x1   . ESOPHAGOGASTRODUODENOSCOPY (EGD) WITH PROPOFOL N/A 08/02/2016   Procedure: ESOPHAGOGASTRODUODENOSCOPY (EGD) WITH PROPOFOL;  Surgeon: Arta Silence, MD;  Location: [facility];  Service: Endoscopy;  Laterality: N/A;  . LAPAROSCOPY     x 2 for endometriosis, and ovarian cysts  . WISDOM TOOTH EXTRACTION      Medications: Current Outpatient Medications  Medication Sig Dispense Refill  . atenolol (TENORMIN) 25 MG tablet Take 1 tablet (25 mg total) by mouth every evening. Please schedule office visit for further refills. Thank you! (Patient not taking: Reported on 11/28/2019) 30 tablet 0  . buPROPion (WELLBUTRIN XL) 150 MG 24 hr tablet TAKE 3 TABLETS BY MOUTH DAILY. 270 tablet 0  . DULoxetine (CYMBALTA) 30 MG capsule 1 po qam for 2 weeks, then 2 po qam. 60 capsule 1  . ibuprofen (ADVIL,MOTRIN) 200 MG tablet Take 600 mg by mouth 2 (two) times daily as needed for headache or moderate pain.    [name] LORazepam (ATIVAN) 0.5 MG tablet TAKE 1/2 TO 1 TABLET BY MOUTH EVERY 8 HOURS AS NEEDED FOR ANXIETY 30 tablet 1  . phentermine (ADIPEX-P) 37.5 MG tablet Take 1/2 tab po BID    . propranolol (INDERAL) 20 MG tablet Take 1 tablet (20 mg total) by mouth 3 (three) times daily as needed. 90 tablet 6  . traZODone (DESYREL) 50 MG tablet CAN TAKE 1-2 QHS PRN 180 tablet 3   No current facility-administered medications for this visit.    No Known Allergies  Today patient reports allergies to pollen and other environmental allergies.  Diagnoses:    ICD-10-CM   1. Generalized anxiety disorder  F41.1     Plan of Care:  Patient not signing treatment plan on computer screen due to Covid.  Treatment Goals: Goals will  remain on treatment plan as patient works with strategies to achieve her goals.  Progress will be noted each session and documented in "Progress" section on Plan.  Long term goal: Alleviate depressed mood and return to previous level of effective functioning.  Will enjoy life more overall.  Short term goal: Verbalize and work through unresolved grief or other issues that may be contributing to her depression.  Strategy: Verbalize and work on positive and hopeful statements regarding her future.  Progress: Today is patient's initial therapy appt, although has had some prior treatment through her EAP.  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 She is a 49 yr old, divorced twice female with one son who is 35 yrs old rising HS senior this year and who has also struggled with anxiety over several years and has been in treatment with benefit. She reports having supportive mom and friends.  Dad died in 2022/10/30  2021 and that has been an added concern for paitent.  She presents today with depression, anxiety, tired, overwhelmed, worries a lot, and some tearfulness. States depression is her strongest symptom and denies any SI. Good sense of humor is helpful to her. Today, after completing her initial evaluation, we worked collaboratively on her treatment goal plan focus on the alleviation of her depression and other related symptoms named above.  Patient states she wants to be happier and enjoy life more. States she has always had high expectations of herself and feels like she has been in "survival mode for quite a while now." She is to go ahead and begin some thought awareness and monitoring exercises which we will pick up on next session, and is motivated for treatment but also needs encouragement.  Goal review with patient at session end.  Next appt within 2-3 weeks.   Shanon Ace, LCSW

## 2020-03-02 ENCOUNTER — Ambulatory Visit (INDEPENDENT_AMBULATORY_CARE_PROVIDER_SITE_OTHER): Payer: No Typology Code available for payment source | Admitting: Physician Assistant

## 2020-03-02 ENCOUNTER — Encounter: Payer: Self-pay | Admitting: Physician Assistant

## 2020-03-02 ENCOUNTER — Other Ambulatory Visit: Payer: Self-pay

## 2020-03-02 DIAGNOSIS — Z634 Disappearance and death of family member: Secondary | ICD-10-CM | POA: Diagnosis not present

## 2020-03-02 DIAGNOSIS — F4321 Adjustment disorder with depressed mood: Secondary | ICD-10-CM | POA: Diagnosis not present

## 2020-03-02 DIAGNOSIS — F411 Generalized anxiety disorder: Secondary | ICD-10-CM | POA: Diagnosis not present

## 2020-03-02 DIAGNOSIS — G4700 Insomnia, unspecified: Secondary | ICD-10-CM | POA: Diagnosis not present

## 2020-03-02 MED ORDER — LORAZEPAM 0.5 MG PO TABS: ORAL_TABLET | ORAL | 1 refills | Status: DC

## 2020-03-02 MED ORDER — DULOXETINE HCL 60 MG PO CPEP
60.0000 mg | ORAL_CAPSULE | Freq: Every morning | ORAL | 0 refills | Status: DC
Start: 2020-03-02 — End: 2020-06-30

## 2020-03-02 NOTE — Progress Notes (Signed)
[facility]  Patient ID: Suzanne Foster,  MRN: [id number]  PCP: Patient, No Pcp Per  Date of Evaluation: 03/02/2020 Time spent:20 minutes  Chief Complaint:  Chief Complaint    Follow-up       HISTORY/CURRENT STATUS: HPI For routine med check.  We weaned off Lexapro and then started Cymbalta about 6 weeks ago.  She has been on the 60 mg now for about 4 weeks.  She feels better than when she was on the Lexapro but would like to feel even better.  She does have more energy and motivation and not quite as down.  Continues to work without problems of inability to go in to the office.  Sleeps pretty good with the trazodone.  Hygiene is normal.  Not crying all the time.  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  She has seen Rinaldo Cloud in counseling 1 time and due to scheduling, she will not be able to see her until another month or so.  No suicidal or homicidal thoughts.  Still has ruminating thoughts.  They are not quite as bad as they were.  Worse at night.  She is trying to use the techniques that she knows will help distract her, but it is easier said than done.  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  Generalized anxiety is a little bit better.  The Ativan does help.  Not really having panic attacks.  Everything is just different and she is having to get accustomed to her new lifestyle.  Patient denies increased energy with decreased need for sleep, no increased talkativeness, no racing thoughts, no impulsivity or risky behaviors, no increased spending, no increased libido, no grandiosity, no increased irritability or anger, and no hallucinations.  Denies dizziness, syncope,  seizures, numbness, tingling, tremor, tics, unsteady gait, slurred speech, confusion. Denies muscle or joint pain, stiffness, or dystonia.  Individual Medical History/ Review of Systems: Changes? :No    Past medications for mental health diagnoses include: Prozac, Ativan, Trazodone, Wellbutrin XL, Lexapro  Allergies: Patient has no known allergies.  Current Medications:  Current Outpatient Medications:    buPROPion (WELLBUTRIN XL) 150 MG 24 hr tablet, TAKE 3 TABLETS BY MOUTH DAILY., Disp: 270 tablet, Rfl: 0   ibuprofen (ADVIL,MOTRIN) 200 MG tablet, Take 600 mg by mouth 2 (two) times daily as needed for headache or moderate pain., Disp: , Rfl:    LORazepam (ATIVAN) 0.5 MG tablet, TAKE 1/2 TO 1 TABLET BY MOUTH EVERY 8 HOURS AS NEEDED FOR ANXIETY, Disp: 30 tablet, Rfl: 1   propranolol (INDERAL) 20 MG tablet, Take 1 tablet (20 mg total) by mouth 3 (three) times daily as needed., Disp: 90 tablet, Rfl: 6   traZODone (DESYREL) 50 MG tablet, CAN TAKE 1-2 QHS PRN, Disp: 180 tablet, Rfl: 3   atenolol (TENORMIN) 25 MG tablet, Take 1 tablet (25 mg total) by mouth every evening. Please schedule office visit for further refills. Thank you! (Patient not taking: Reported on 11/28/2019), Disp: 30 tablet, Rfl: 0   DULoxetine (CYMBALTA) 60 MG capsule, Take 1 capsule (60 mg total) by mouth in the morning., Disp: 90 capsule, Rfl: 0  phentermine (ADIPEX-P) 37.5 MG tablet, Take 1/2 tab po BID (Patient not taking: Reported on 03/02/2020), Disp: , Rfl:  Medication Side Effects: none  Family Medical/ Social History: Changes? No  MENTAL HEALTH EXAM:  There were no vitals taken for this visit.There is no height or weight on file to calculate BMI.  General Appearance: Casual, Neat, Well Groomed and Obese  Eye Contact:  Good  Speech:  Clear and Coherent and Normal Rate  Volume:  Normal  Mood:  Euthymic  Affect:  Appropriate  Thought Process:  Goal Directed and Descriptions of Associations: Intact   Orientation:  Full (Time, Place, and Person)  Thought Content: Logical   Suicidal Thoughts:  No  Homicidal Thoughts:  No  Memory:  WNL  Judgement:  Good  Insight:  Good  Psychomotor Activity:  Normal  Concentration:  Concentration: Good  Recall:  Good  Fund of Knowledge: Good  Language: Good  Assets:  Desire for Improvement  ADL's:  Intact  Cognition: WNL  Prognosis:  Good    DIAGNOSES:    ICD-10-CM   1. Situational depression  F43.21   2. Generalized anxiety disorder  F41.1   3. Insomnia, unspecified type  G47.00   4. Bereavement  Z63.4     Receiving Psychotherapy: Yes Rinaldo Cloud, LCSW.   RECOMMENDATIONS:  PDMP reviewed We discussed her different options.  Since she has only been on the current dose of Cymbalta for 4 weeks, it is wise to have her stay on this dose for at least another 2 weeks before deciding to increase the dose.  She is okay with that and will call after 2 to 3 weeks if symptoms of depression are not improving even more.  I will increase Cymbalta to 90 mg at that point. Continue Cymbalta 60 mg, 1 p.o. every morning. Continue Ativan 0.5 mg, 1/2-1 p.o. 3 times daily as needed anxiety. Continue trazodone 50 mg, 1-2 p.o. nightly as needed sleep. Continue therapy with Rinaldo Cloud, LCSW. Return in 3 months.  Donnal Moat, PA-C

## 2020-04-05 ENCOUNTER — Ambulatory Visit: Payer: No Typology Code available for payment source | Admitting: Psychiatry

## 2020-04-05 ENCOUNTER — Other Ambulatory Visit: Payer: Self-pay

## 2020-04-05 DIAGNOSIS — F4321 Adjustment disorder with depressed mood: Secondary | ICD-10-CM | POA: Diagnosis not present

## 2020-04-05 NOTE — Progress Notes (Signed)
      [facility] Counselor/Therapist Progress Note  Patient ID: Suzanne Foster, MRN: [id number],    Date: 04/05/2020  Time Spent: 60 minutes  5:00pm to 6:00pm  Treatment Type: Individual Therapy  Reported Symptoms:  Depression, some ruminating with my thoughts but meds are helping some and feeling 50% better.  Mental Status Exam:  Appearance:   Casual     Behavior:  Appropriate, Sharing and Motivated  Motor:  Normal  Speech/Language:   Normal Rate  Affect:  Depressed  Mood:  depressed  Thought process:  normal  Thought content:    WNL and some runimation  Sensory/Perceptual disturbances:    WNL  Orientation:  oriented to person, place, time/date, situation, day of week, month of year and year  Attention:  Good  Concentration:  Good  Memory:  WNL  Fund of knowledge:   Good  Insight:    Good  Judgment:   Good  Impulse Control:  Good   Risk Assessment: Danger to Self:  No Self-injurious Behavior: No Danger to Others: No Duty to Warn:no Physical Aggression / Violence:No  Access to Firearms a concern: No  Gang Involvement:No   Subjective: Patient reporting depression and some ruminations but also noticing significant improvement with medication.  Interventions: Cognitive Behavioral Therapy and Solution-Oriented/Positive Psychology  Diagnosis:   ICD-10-CM   1. Situational depression  F43.21     Plan of Care:  Patient not signing treatment plan on computer screen due to Covid.  Treatment Goals: Goals will remain on treatment plan as patient works with strategies to achieve her goals.  Progress will be noted each session and documented in "Progress" section on Plan.  Long term goal: Alleviate depressed mood and return to previous level of effective functioning.  Will enjoy life more overall.  Short term goal: Verbalize and work through unresolved grief or other issues that may be contributing to her depression.  Strategy: Verbalize and work on positive and  hopeful statements regarding her future.  Progress: Patient in today with the depression decreasing some to about a "4" on a 1-10 scale of depression. Still trying to adjust to break-up, dad's death, and new job which all continue to be stressful for her. Processed her feelings in more detail and she acknowledges she has made some real progress and her meds have begun helping more, to where she is feeling 50% better. Reporting less tearfulness, not as tired feeling, not quite as overwhelmed, less anxious, and less depressed.  Does still worry some but it too, has decreased. States her meds were increased and since last appt she has noticed feeling better and much less symptomatic although not totally symptom-free.  Is continuing to work with her anxious thoughts and also with her tendency to overthink, both of which we worked with some today.  Feeling better has understandably given patient an emotional boost and she seems more motivated at this point to follow up on suggestions and her meds.  Depending on how she continues to feel, will depend on her course of therapy, as far as how much longer she will need to be seen by therapist.  To continue goal-directed treatment at this time and re-evaluate next session.  Goal review and progress noted with patient.  Next appt within 2-3 weeks.   Shanon Ace, LCSW

## 2020-04-19 ENCOUNTER — Ambulatory Visit: Payer: PRIVATE HEALTH INSURANCE | Admitting: Psychiatry

## 2020-05-03 ENCOUNTER — Ambulatory Visit: Payer: PRIVATE HEALTH INSURANCE | Admitting: Psychiatry

## 2020-05-17 ENCOUNTER — Ambulatory Visit: Payer: PRIVATE HEALTH INSURANCE | Admitting: Psychiatry

## 2020-05-21 ENCOUNTER — Other Ambulatory Visit: Payer: Self-pay | Admitting: Physician Assistant

## 2020-05-25 NOTE — Telephone Encounter (Signed)
Suzanne Foster called to check on status of her Wellbutrin.  She will be out today.  Knows we weren't here this weekend, but wanted to be sure it was sent in today.

## 2020-06-02 ENCOUNTER — Ambulatory Visit: Payer: PRIVATE HEALTH INSURANCE | Admitting: Physician Assistant

## 2020-06-30 ENCOUNTER — Other Ambulatory Visit: Payer: Self-pay | Admitting: Physician Assistant

## 2020-06-30 NOTE — Telephone Encounter (Signed)
Please review

## 2020-07-09 ENCOUNTER — Ambulatory Visit (INDEPENDENT_AMBULATORY_CARE_PROVIDER_SITE_OTHER): Payer: No Typology Code available for payment source | Admitting: Physician Assistant

## 2020-07-09 ENCOUNTER — Other Ambulatory Visit: Payer: Self-pay

## 2020-07-09 ENCOUNTER — Encounter: Payer: Self-pay | Admitting: Physician Assistant

## 2020-07-09 DIAGNOSIS — F3341 Major depressive disorder, recurrent, in partial remission: Secondary | ICD-10-CM

## 2020-07-09 DIAGNOSIS — G4700 Insomnia, unspecified: Secondary | ICD-10-CM

## 2020-07-09 DIAGNOSIS — F411 Generalized anxiety disorder: Secondary | ICD-10-CM | POA: Diagnosis not present

## 2020-07-09 DIAGNOSIS — Z634 Disappearance and death of family member: Secondary | ICD-10-CM

## 2020-07-09 MED ORDER — DULOXETINE HCL 60 MG PO CPEP: ORAL_CAPSULE | ORAL | 1 refills | Status: DC

## 2020-07-09 MED ORDER — BUPROPION HCL ER (XL) 150 MG PO TB24: 450.0000 mg | ORAL_TABLET | Freq: Every day | ORAL | 1 refills | Status: DC

## 2020-07-09 NOTE — Progress Notes (Signed)
[facility]  Patient ID: Suzanne Foster,  MRN: [id number]  PCP: Patient, No Pcp Per  Date of Evaluation: 07/09/2020 Time spent:20 minutes  Chief Complaint:  Chief Complaint    Depression; Medication Refill       HISTORY/CURRENT STATUS: HPI For routine med check.  Feels that meds are working well. Is able to enjoy things. Energy and motivation are good for the most part.  Has a new puppy and its very active so that keeps her active too. Not crying easily. Motivation is good. No SI/HI.  It'll be a year in 2022-10-26 since her father died. Continues to grieve, but normal grief it seems. 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  Generalized anxiety is still responding to the Ativan.  Rarely takes it. Sleeps ok most of the time. Trazodone helps.   Patient denies increased energy with decreased need for sleep, no increased talkativeness, no racing thoughts, no impulsivity or risky behaviors, no increased spending, no increased libido, no grandiosity, no increased irritability or anger, no paranoia, and no hallucinations.  Denies dizziness, syncope, seizures, numbness, tingling, tremor, tics, unsteady gait, slurred speech, confusion. Denies muscle or joint pain, stiffness, or dystonia.  Individual Medical History/ Review of Systems: Changes? :No    Past medications for mental health diagnoses include: Prozac, Ativan, Trazodone, Wellbutrin XL, Lexapro  Allergies: Patient has no known allergies.  Current Medications:  Current Outpatient Medications:  .  buPROPion (WELLBUTRIN XL) 150 MG 24 hr tablet, Take 3 tablets (450 mg total) by mouth daily., Disp: 270 tablet, Rfl: 1 .  DULoxetine (CYMBALTA) 60 MG capsule, TAKE 1 CAPSULE BY MOUTH IN THE MORNING., Disp: 90 capsule, Rfl: 1 .  ibuprofen (ADVIL,MOTRIN) 200 MG tablet, Take 600 mg by mouth 2 (two) times daily as needed for headache or moderate pain., Disp: , Rfl:  .  LORazepam (ATIVAN) 0.5 MG tablet, TAKE 1/2 TO 1 TABLET BY  MOUTH EVERY 8 HOURS AS NEEDED FOR ANXIETY, Disp: 30 tablet, Rfl: 1 .  propranolol (INDERAL) 20 MG tablet, Take 1 tablet (20 mg total) by mouth 3 (three) times daily as needed., Disp: 90 tablet, Rfl: 6 .  traZODone (DESYREL) 50 MG tablet, CAN TAKE 1-2 QHS PRN, Disp: 180 tablet, Rfl: 3 .  atenolol (TENORMIN) 25 MG tablet, Take 1 tablet (25 mg total) by mouth every evening. Please schedule office visit for further refills. Thank you! (Patient not taking: Reported on 11/28/2019), Disp: 30 tablet, Rfl: 0 .  phentermine (ADIPEX-P) 37.5 MG tablet, Take 1/2 tab po BID (Patient not taking: Reported on 03/02/2020), Disp: , Rfl:  Medication Side Effects: none  Family Medical/ Social History: Changes? No  MENTAL HEALTH EXAM:  There were no vitals taken for this visit.There is no height or weight on file to calculate BMI.  General Appearance: Casual, Neat, Well Groomed and Obese  Eye Contact:  Good  Speech:  Clear and Coherent and Normal Rate  Volume:  Normal  Mood:  Euthymic  Affect:  Appropriate  Thought Process:  Goal Directed and Descriptions of Associations: Intact  Orientation:  Full (Time, Place, and Person)  Thought Content: Logical   Suicidal Thoughts:  No  Homicidal Thoughts:  No  Memory:  WNL  Judgement:  Good  Insight:  Good  Psychomotor Activity:  Normal  Concentration:  Concentration: Good  Recall:  Good  Fund of Knowledge: Good  Language: Good  Assets:  Desire for Improvement  ADL's:  Intact  Cognition: WNL  Prognosis:  Good  DIAGNOSES:    ICD-10-CM   1. Recurrent major depressive disorder, in partial remission ([place])  F33.41   2. Bereavement  Z63.4   3. Generalized anxiety disorder  F41.1   4. Insomnia, unspecified type  G47.00     Receiving Psychotherapy: No     RECOMMENDATIONS:  PDMP reviewed I provided 20 minutes of face to face time during this encounter. Continue Wellbutrin XL 150 mg, 3 po qd. Continue Cymbalta 60 mg, 1 p.o. every morning. Continue Ativan  0.5 mg, 1/2-1 p.o. 3 times daily as needed anxiety. Continue trazodone 50 mg, 1-2 p.o. nightly as needed sleep. Return in 6 months.  Donnal Moat, PA-C

## 2020-12-14 ENCOUNTER — Other Ambulatory Visit: Payer: Self-pay | Admitting: Physician Assistant

## 2020-12-17 ENCOUNTER — Other Ambulatory Visit: Payer: Self-pay | Admitting: Physician Assistant

## 2020-12-17 NOTE — Telephone Encounter (Signed)
Controlled substance 

## 2021-01-05 ENCOUNTER — Other Ambulatory Visit: Payer: Self-pay

## 2021-01-05 ENCOUNTER — Encounter: Payer: Self-pay | Admitting: Physician Assistant

## 2021-01-05 ENCOUNTER — Ambulatory Visit (INDEPENDENT_AMBULATORY_CARE_PROVIDER_SITE_OTHER): Payer: No Typology Code available for payment source | Admitting: Physician Assistant

## 2021-01-05 DIAGNOSIS — E663 Overweight: Secondary | ICD-10-CM

## 2021-01-05 DIAGNOSIS — G4700 Insomnia, unspecified: Secondary | ICD-10-CM

## 2021-01-05 DIAGNOSIS — F411 Generalized anxiety disorder: Secondary | ICD-10-CM | POA: Diagnosis not present

## 2021-01-05 DIAGNOSIS — F339 Major depressive disorder, recurrent, unspecified: Secondary | ICD-10-CM

## 2021-01-05 NOTE — Progress Notes (Signed)
[facility]  Patient ID: Suzanne Foster,  MRN: [id number]  PCP: Patient, No Pcp Per (Inactive)  Date of Evaluation: 01/05/2021 Time spent:40 minutes  Chief Complaint:  Chief Complaint    Anxiety; Depression; Insomnia; Follow-up      HISTORY/CURRENT STATUS: HPI For routine med check. Still very depressed.  Still battling with depression, not missing work. Is able to function in daily life. Feels sad a lot, Mom in [place] is putting a guilt trip on her for her to move back to [place] since her dad died over a year ago.  Feels blue a lot, no joy in life.  Does not want to do things unless she has to.  Sleeps okay and would probably like to sleep more.  Not really crying.  Does have anxiety and the Ativan helps that.  No suicidal or homicidal thoughts.  Patient denies increased energy with decreased need for sleep, no increased talkativeness, no racing thoughts, no impulsivity or risky behaviors, no increased spending, no increased libido, no grandiosity, no increased irritability or anger, and no hallucinations.  Going through menopause now and wonders if that is affecting her mood as well.  Her son is graduating HS, barely.  Denies dizziness, syncope, seizures, numbness, tingling, tremor, tics, unsteady gait, slurred speech, confusion. Denies muscle or joint pain, stiffness, or dystonia.  Individual Medical History/ Review of Systems: Changes? :Yes   Past medications for mental health diagnoses include: Prozac, Ativan, Trazodone, Wellbutrin XL, Lexapro  Allergies: Patient has no known allergies.  Current Medications:  Current Outpatient Medications:  .  atenolol (TENORMIN) 25 MG tablet, Take 1 tablet (25 mg total) by mouth every evening. Please schedule office visit for further refills. Thank you!, Disp: 30 tablet, Rfl: 0 .  buPROPion (WELLBUTRIN XL) 150 MG 24 hr tablet, Take 3 tablets (450 mg total) by mouth daily., Disp: 270 tablet, Rfl: 0 .  DULoxetine (CYMBALTA) 60 MG  capsule, TAKE 1 CAPSULE BY MOUTH IN THE MORNING., Disp: 90 capsule, Rfl: 1 .  ibuprofen (ADVIL,MOTRIN) 200 MG tablet, Take 600 mg by mouth 2 (two) times daily as needed for headache or moderate pain., Disp: , Rfl:  .  LORazepam (ATIVAN) 0.5 MG tablet, TAKE 1/2 TO 1 TABLET BY MOUTH EVERY 8 HOURS AS NEEDED FOR ANXIETY, Disp: 30 tablet, Rfl: 1 .  traZODone (DESYREL) 50 MG tablet, CAN TAKE 1-2 QHS PRN, Disp: 180 tablet, Rfl: 3 .  propranolol (INDERAL) 20 MG tablet, Take 1 tablet (20 mg total) by mouth 3 (three) times daily as needed. (Patient not taking: Reported on 01/05/2021), Disp: 90 tablet, Rfl: 6 Medication Side Effects: none  Family Medical/ Social History: Changes? No  MENTAL HEALTH EXAM:  There were no vitals taken for this visit.There is no height or weight on file to calculate BMI.  General Appearance: Casual, Neat and Well Groomed  Eye Contact:  Good  Speech:  Blocked and Normal Rate  Volume:  Normal  Mood:  Depressed  Affect:  Depressed  Thought Process:  Goal Directed and Descriptions of Associations: Circumstantial  Orientation:  Full (Time, Place, and Person)  Thought Content: Logical   Suicidal Thoughts:  No  Homicidal Thoughts:  No  Memory:  WNL  Judgement:  Good  Insight:  Good  Psychomotor Activity:  Normal  Concentration:  Concentration: Good  Recall:  Good  Fund of Knowledge: Good  Language: Good  Assets:  Desire for Improvement  ADL's:  Intact  Cognition: WNL  Prognosis:  Good  DIAGNOSES:    ICD-10-CM   1. Recurrent major depression resistant to treatment ([place])  F33.9   2. Generalized anxiety disorder  F41.1   3. Insomnia, unspecified type  G47.00   4. Overweight  E66.3     Receiving Psychotherapy: No    RECOMMENDATIONS:  PDMP was reviewed. I provided 40 minutes of face-to-face time during this encounter, including time spent in records review and charting. We discussed the benefits of Gene site testing.  Specimen was obtained with her consent.   She understands the results may be helpful in guiding future treatment and I suggest making no changes until we get those results. Continue Wellbutrin XL 150 mg, 3 p.o. every morning. Continue Cymbalta 60 mg, 1 p.o. every morning. Continue Ativan 0.5 mg, 1/2-1 nightly 8H as needed anxiety. Continue trazodone 50 mg, 1-2 nightly as needed sleep. Return in 4 weeks.  Donnal Moat, PA-C

## 2021-02-02 ENCOUNTER — Other Ambulatory Visit: Payer: Self-pay

## 2021-02-02 ENCOUNTER — Ambulatory Visit (INDEPENDENT_AMBULATORY_CARE_PROVIDER_SITE_OTHER): Payer: No Typology Code available for payment source | Admitting: Physician Assistant

## 2021-02-02 DIAGNOSIS — F411 Generalized anxiety disorder: Secondary | ICD-10-CM

## 2021-02-02 DIAGNOSIS — F339 Major depressive disorder, recurrent, unspecified: Secondary | ICD-10-CM | POA: Diagnosis not present

## 2021-02-02 DIAGNOSIS — G4700 Insomnia, unspecified: Secondary | ICD-10-CM | POA: Diagnosis not present

## 2021-02-02 MED ORDER — VIIBRYD 20 MG PO TABS: 20.0000 mg | ORAL_TABLET | Freq: Every day | ORAL | 1 refills | Status: DC

## 2021-02-02 MED ORDER — DULOXETINE HCL 30 MG PO CPEP: ORAL_CAPSULE | ORAL | 0 refills | Status: DC

## 2021-02-02 NOTE — Progress Notes (Signed)
[facility]  Patient ID: Suzanne Foster,  MRN: [id number]  PCP: Patient, No Pcp Per (Inactive)  Date of Evaluation: 02/02/2021 Time spent:40 minutes  Chief Complaint:  Chief Complaint    Depression; Follow-up      HISTORY/CURRENT STATUS: HPI here to discuss Gene site test results.  Still depressed.  At the last visit we obtained specimen for Gene site testing.  See results under "media" on chart.  Unfortunately the lab did not run the MTHFR test so I do not have that today.  She is still able to work.  She is a Education officer, museum at [employer].  Just has a hard time enjoying anything, energy and motivation are very low.  Isolates to some degree.  If having to go out is not related to her job, she prefers to stay at home.  Does not cry easily.  Appetite has increased.  Emotional eater.  Denies suicidal or homicidal thoughts.  Patient denies increased energy with decreased need for sleep, no increased talkativeness, no racing thoughts, no impulsivity or risky behaviors, no increased spending, no increased libido, no grandiosity, no increased irritability or anger, and no hallucinations.  She still gets anxious, more so generalized than panic attacks.  She often just feels overwhelmed either at work or at home and cannot get certain thoughts out of her head.  Worries about a lot of different things.  Very hesitant to take the Ativan.  Afraid she may get hooked on it.  Denies dizziness, syncope, seizures, numbness, tingling, tremor, tics, unsteady gait, slurred speech, confusion. Denies muscle or joint pain, stiffness, or dystonia.  Individual Medical History/ Review of Systems: Changes? :No   Past medications for mental health diagnoses include: Prozac, Ativan, Trazodone, Wellbutrin XL, Lexapro  Allergies: Patient has no known allergies.  Current Medications:  Current Outpatient Medications:  .  DULoxetine (CYMBALTA) 30 MG capsule, 1 daily for 1 week, then open up capsule,  sprinkle 1/2 contents in a tsp of yogurt or applesauce, daily for 1 week and then stop, Disp: 14 capsule, Rfl: 0 .  Vilazodone HCl (VIIBRYD) 20 MG TABS, Take 1 tablet (20 mg total) by mouth daily., Disp: 30 tablet, Rfl: 1 .  atenolol (TENORMIN) 25 MG tablet, Take 1 tablet (25 mg total) by mouth every evening. Please schedule office visit for further refills. Thank you!, Disp: 30 tablet, Rfl: 0 .  buPROPion (WELLBUTRIN XL) 150 MG 24 hr tablet, Take 3 tablets (450 mg total) by mouth daily., Disp: 270 tablet, Rfl: 0 .  ibuprofen (ADVIL,MOTRIN) 200 MG tablet, Take 600 mg by mouth 2 (two) times daily as needed for headache or moderate pain., Disp: , Rfl:  .  LORazepam (ATIVAN) 0.5 MG tablet, TAKE 1/2 TO 1 TABLET BY MOUTH EVERY 8 HOURS AS NEEDED FOR ANXIETY, Disp: 30 tablet, Rfl: 1 .  propranolol (INDERAL) 20 MG tablet, Take 1 tablet (20 mg total) by mouth 3 (three) times daily as needed. (Patient not taking: Reported on 01/05/2021), Disp: 90 tablet, Rfl: 6 Medication Side Effects: none  Family Medical/ Social History: Changes? No  MENTAL HEALTH EXAM:  There were no vitals taken for this visit.There is no height or weight on file to calculate BMI.  General Appearance: Casual, Neat, Well Groomed and Obese  Eye Contact:  Good  Speech:  Blocked and Normal Rate  Volume:  Normal  Mood:  Depressed  Affect:  Depressed  Thought Process:  Goal Directed and Descriptions of Associations: Circumstantial  Orientation:  Full (Time, Place,  and Person)  Thought Content: Logical   Suicidal Thoughts:  No  Homicidal Thoughts:  No  Memory:  WNL  Judgement:  Good  Insight:  Good  Psychomotor Activity:  Normal  Concentration:  Concentration: Good  Recall:  Good  Fund of Knowledge: Good  Language: Good  Assets:  Desire for Improvement  ADL's:  Intact  Cognition: WNL  Prognosis:  Good   Gene site test results from 01/05/2021: See on chart under media.  DIAGNOSES:    ICD-10-CM   1. Recurrent major  depression resistant to treatment ([place])  F33.9   2. Generalized anxiety disorder  F41.1   3. Insomnia, unspecified type  G47.00     Receiving Psychotherapy: No    RECOMMENDATIONS:  PDMP was reviewed. I provided 40 minutes of face-to-face time during this encounter, including time spent in records review, medical decision making, and charting. We discussed the Gene site test results.  Unfortunately the MTHFR that was ordered was not performed.  That has been reordered.   The only medications in the "use as directed" column are Pristiq, Fetzima, Effexor, and Viibryd.  She is on Cymbalta and has had no improvement at all so we decided not to go straight from one SNRI to another.  We agreed to start Viibryd, a 2-week sample pack was given.  She knows that we will most likely have to have a prior authorization done but I do not foresee problems with that as she has been on several different antidepressants of different classes that have not been effective.  The one that has helped the most for the longest was Prozac but over time and even in the past 5 years or so at 80 mg, it was no longer effective. Discontinue trazodone due to increased somnolence the next day. We may decide to wean off or at least decrease the dose of the Wellbutrin.  We will wait to discuss that later because I do not want to make too many changes at once.  She agrees. Wean off Cymbalta by taking 30 mg, 1 p.o. daily for 1 week, then week 2: sprinkle one half of contents of a capsule in 1 teaspoon of applesauce or other soft food daily for 1 week and then stop.  If she does have "brain zaps" or any other withdrawals like dizziness, nausea or what ever, call and we will have her go down by 15 mg increments. Start Viibryd 10 mg daily for 1 week and then 20 mg.  Sample pack was given for this 2 weeks.  I sent in a prescription for Viibryd 20 mg, 1 p.o. daily.  She will start the sample pack when she goes down to approximately 15 mg of  Cymbalta. Continue Wellbutrin XL 150 mg, 3 p.o. every morning.  Continue Ativan 0.5 mg, 1/2-1 nightly 8H as needed anxiety. I will let her know when the final results I have Gene site test are back. Return in 2 months.  Donnal Moat, PA-C

## 2021-02-03 ENCOUNTER — Telehealth: Payer: Self-pay | Admitting: Physician Assistant

## 2021-02-03 NOTE — Telephone Encounter (Signed)
I spoke with the pharmacist, Ronny Bacon, who told me the Cymbalta capsule cannot be opened and sprinkled.  I changed the order from 14 pills to 30 pills with the directions of taking 1 p.o. daily.  I then called [name], told her about this change that we will be on the bottle but take it as we discussed yesterday.  I told her this might be an issue so she was expecting it.  She understands to take the 30 mg capsule, 1 daily for 1 week, and then open it up, sprinkle approximately one half of contents in a teaspoon of applesauce or equivalent, for 1 week and then stop.  However, if she starts feeling strange with dizziness, or having brain zaps after dropping from 60 mg to 30 mg rapidly, then she will take 1.5 pills daily for 1 week then 1 p.o. daily for 1 week, then one half daily for 1 week and then stop.  She verbalizes understanding.

## 2021-02-03 NOTE — Telephone Encounter (Signed)
Suzanne Foster at [facility] called and said that the form of Duloxetine that was ordered is not available from the manufacturer. Please call [phone].

## 2021-02-03 NOTE — Telephone Encounter (Signed)
Pt called about the medicine Duloxetine. She is requesting we take care of this asap because she doesn't want to go without her medication. Suzanne Foster warned of side effects if she doesn't take it. Please see pharmacy message

## 2021-02-03 NOTE — Telephone Encounter (Signed)
Please review

## 2021-02-06 ENCOUNTER — Encounter: Payer: Self-pay | Admitting: Physician Assistant

## 2021-02-15 ENCOUNTER — Telehealth: Payer: Self-pay | Admitting: Physician Assistant

## 2021-02-15 ENCOUNTER — Other Ambulatory Visit: Payer: Self-pay | Admitting: Physician Assistant

## 2021-02-15 MED ORDER — DEPLIN 15 15-90.314 MG PO CAPS: 15.0000 mg | ORAL_CAPSULE | Freq: Every day | ORAL | 3 refills | Status: DC

## 2021-02-15 NOTE — Telephone Encounter (Signed)
Pt called and since the genetic testing said that deplin would benefit her. She would like a script sent to the [facility]

## 2021-02-15 NOTE — Telephone Encounter (Signed)
Please review

## 2021-02-15 NOTE — Telephone Encounter (Signed)
Pt informed

## 2021-02-15 NOTE — Telephone Encounter (Signed)
Yes the MTHFR did show a reduced folic acid conversion which means Deplin will be beneficial.  She and I talked about the treatment when she was here at next visit.  I sent the prescription to her pharmacy, but if her insurance will not pay, we'll send a prescription to the specialty pharmacy in [place] that we will mail the Deplin to her.

## 2021-02-16 NOTE — Telephone Encounter (Signed)
reviewed

## 2021-04-01 ENCOUNTER — Other Ambulatory Visit: Payer: Self-pay | Admitting: Physician Assistant

## 2021-04-04 ENCOUNTER — Ambulatory Visit: Payer: No Typology Code available for payment source | Admitting: Physician Assistant

## 2021-04-05 NOTE — Telephone Encounter (Signed)
Had apt yesterday 7/18?

## 2021-04-22 ENCOUNTER — Other Ambulatory Visit: Payer: Self-pay | Admitting: Physician Assistant

## 2021-04-22 NOTE — Telephone Encounter (Signed)
Please schedule appt

## 2021-04-27 NOTE — Telephone Encounter (Signed)
Last filled 7/10

## 2021-04-27 NOTE — Telephone Encounter (Signed)
Pt made an appt in September. [facility] fill the viibryd ASAp. She is going out of town

## 2021-06-16 ENCOUNTER — Ambulatory Visit (INDEPENDENT_AMBULATORY_CARE_PROVIDER_SITE_OTHER): Payer: No Typology Code available for payment source | Admitting: Physician Assistant

## 2021-06-16 ENCOUNTER — Encounter: Payer: Self-pay | Admitting: Physician Assistant

## 2021-06-16 ENCOUNTER — Other Ambulatory Visit: Payer: Self-pay

## 2021-06-16 DIAGNOSIS — Z1589 Genetic susceptibility to other disease: Secondary | ICD-10-CM

## 2021-06-16 DIAGNOSIS — F411 Generalized anxiety disorder: Secondary | ICD-10-CM

## 2021-06-16 DIAGNOSIS — G4700 Insomnia, unspecified: Secondary | ICD-10-CM

## 2021-06-16 DIAGNOSIS — F3341 Major depressive disorder, recurrent, in partial remission: Secondary | ICD-10-CM | POA: Diagnosis not present

## 2021-06-16 MED ORDER — VILAZODONE HCL 20 MG PO TABS: 20.0000 mg | ORAL_TABLET | Freq: Every day | ORAL | 1 refills | Status: DC

## 2021-06-16 MED ORDER — BUPROPION HCL ER (XL) 150 MG PO TB24: 450.0000 mg | ORAL_TABLET | Freq: Every day | ORAL | 1 refills | Status: DC

## 2021-06-16 MED ORDER — LORAZEPAM 0.5 MG PO TABS: 0.2500 mg | ORAL_TABLET | Freq: Three times a day (TID) | ORAL | 1 refills | Status: DC | PRN

## 2021-06-16 NOTE — Progress Notes (Signed)
[facility]  Patient ID: Suzanne Foster,  MRN: [id number]  PCP: Patient, No Pcp Per (Inactive)  Date of Evaluation: 06/16/2021 Time spent:20 minutes  Chief Complaint:  Chief Complaint   Anxiety; Depression; Follow-up      HISTORY/CURRENT STATUS: HPI  For routine med check.  Since going on the [place] in May, she has been feeling much better.  Even with stressors going on in the family she has been able to handle things well without too much sadness.  She is able to enjoy things.  Energy and motivation are good.  She sleeps well most of the time.  She still rarely takes the Ativan but when she does take it it is effective.  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  No suicidal or homicidal thoughts.  Patient denies increased energy with decreased need for sleep, no increased talkativeness, no racing thoughts, no impulsivity or risky behaviors, no increased spending, no increased libido, no grandiosity, no increased irritability or anger, and no hallucinations.  Denies dizziness, syncope, seizures, numbness, tingling, tremor, tics, unsteady gait, slurred speech, confusion. Denies muscle or joint pain, stiffness, or dystonia.  Individual Medical History/ Review of Systems: Changes? :No   Past medications for mental health diagnoses include: Prozac, Ativan, Trazodone, Wellbutrin XL, Lexapro, Cymbalta, Viibryd  Allergies: Patient has no known allergies.  Current Medications:  Current Outpatient Medications:    atenolol (TENORMIN) 25 MG tablet, Take 1 tablet (25 mg total) by mouth every evening. Please schedule office visit for further refills. Thank you!, Disp: 30 tablet, Rfl: 0   ibuprofen (ADVIL,MOTRIN) 200 MG tablet, Take 600 mg by mouth 2 (two) times daily as needed for headache or moderate pain., Disp: , Rfl:    traZODone (DESYREL) 50 MG tablet, Take 50 mg by mouth at bedtime as needed for sleep., Disp: , Rfl:    buPROPion  (WELLBUTRIN XL) 150 MG 24 hr tablet, Take 3 tablets (450 mg total) by mouth daily., Disp: 270 tablet, Rfl: 1   L-Methylfolate-Algae (DEPLIN 15) 15-90.314 MG CAPS, Take 15 mg by mouth daily. (Patient not taking: Reported on 06/16/2021), Disp: 90 capsule, Rfl: 3   LORazepam (ATIVAN) 0.5 MG tablet, Take 0.5-1 tablets (0.25-0.5 mg total) by mouth every 8 (eight) hours as needed for anxiety., Disp: 30 tablet, Rfl: 1   propranolol (INDERAL) 20 MG tablet, Take 1 tablet (20 mg total) by mouth 3 (three) times daily as needed. (Patient not taking: No sig reported), Disp: 90 tablet, Rfl: 6   Vilazodone HCl (VIIBRYD) 20 MG TABS, Take 1 tablet (20 mg total) by mouth daily., Disp: 90 tablet, Rfl: 1 Medication Side Effects: none  Family Medical/ Social History: Changes? Mom had a stroke since our last visit but fortunately is able to live on her own.  Patient's son graduated from high school.  MENTAL HEALTH EXAM:  There were no vitals taken for this visit.There is no height or weight on file to calculate BMI.  General Appearance: Casual, Neat, and Well Groomed  Eye Contact:  Good  Speech:  Blocked and Normal Rate  Volume:  Normal  Mood:  Euthymic  Affect:  Appropriate  Thought Process:  Goal Directed and Descriptions of Associations: Intact  Orientation:  Full (Time, Place, and Person)  Thought Content: Logical   Suicidal Thoughts:  No  Homicidal Thoughts:  No  Memory:  WNL  Judgement:  Good  Insight:  Good  Psychomotor Activity:  Normal  Concentration:  Concentration: Good  Recall:  Good  Fund of Knowledge: Good  Language: Good  Assets:  Desire for Improvement  ADL's:  Intact  Cognition: WNL  Prognosis:  Good   Gene site test results from 01/05/2021: See on chart under media.  DIAGNOSES:    ICD-10-CM   1. Recurrent major depressive disorder, in partial remission ([place])  F33.41     2. Generalized anxiety disorder  F41.1     3. Insomnia, unspecified type  G47.00     4. MTHFR mutation   Z15.89        Receiving Psychotherapy: No    RECOMMENDATIONS:  PDMP was reviewed.  Last Ativan filled 12/21/2020. I provided 20 minutes of face to face time during this encounter, including time spent before and after the visit in records review, medical decision making, and charting.  I am glad to see her doing better!  No changes are needed. Brief discussed the Deplin.  It may help her feel better or it may not.  She does not have severely reduced conversion of methyl folate to folate so if she chooses not to take it then that is okay. Continue Trazodone 50 mg, 1-2 qhs prn. (She has some from an old prescription.  I will send in if needed though.) Continue Viibryd 20 mg, 1 p.o. daily. Continue Wellbutrin XL 150 mg, 3 p.o. every morning. Continue Ativan 0.5 mg, 1/2-1 p.o. 3 times daily as needed.  She takes it very rarely. Return in 6 months.  Donnal Moat, PA-C

## 2021-07-01 ENCOUNTER — Other Ambulatory Visit: Payer: Self-pay | Admitting: Physician Assistant

## 2021-07-04 ENCOUNTER — Encounter: Payer: Self-pay | Admitting: Physician Assistant

## 2021-11-08 LAB — EXTERNAL GENERIC LAB PROCEDURE: COLOGUARD: NEGATIVE

## 2021-11-08 LAB — COLOGUARD: COLOGUARD: NEGATIVE

## 2021-12-12 ENCOUNTER — Other Ambulatory Visit: Payer: Self-pay | Admitting: Physician Assistant

## 2021-12-14 ENCOUNTER — Ambulatory Visit (INDEPENDENT_AMBULATORY_CARE_PROVIDER_SITE_OTHER): Payer: No Typology Code available for payment source | Admitting: Physician Assistant

## 2021-12-14 ENCOUNTER — Encounter: Payer: Self-pay | Admitting: Physician Assistant

## 2021-12-14 DIAGNOSIS — Z1589 Genetic susceptibility to other disease: Secondary | ICD-10-CM | POA: Diagnosis not present

## 2021-12-14 DIAGNOSIS — G4700 Insomnia, unspecified: Secondary | ICD-10-CM | POA: Diagnosis not present

## 2021-12-14 DIAGNOSIS — F411 Generalized anxiety disorder: Secondary | ICD-10-CM

## 2021-12-14 DIAGNOSIS — F339 Major depressive disorder, recurrent, unspecified: Secondary | ICD-10-CM | POA: Diagnosis not present

## 2021-12-14 MED ORDER — BUPROPION HCL ER (XL) 150 MG PO TB24: 450.0000 mg | ORAL_TABLET | Freq: Every day | ORAL | 1 refills | Status: DC

## 2021-12-14 MED ORDER — LORAZEPAM 0.5 MG PO TABS: 0.2500 mg | ORAL_TABLET | Freq: Three times a day (TID) | ORAL | 1 refills | Status: DC | PRN

## 2021-12-14 MED ORDER — VILAZODONE HCL 40 MG PO TABS: 40.0000 mg | ORAL_TABLET | Freq: Every day | ORAL | 1 refills | Status: DC

## 2021-12-14 NOTE — Progress Notes (Signed)
[facility] ? ?Patient ID: Suzanne Foster,  ?MRN: [id number] ? ?PCP: Kathreen Devoid, PA-C ? ?Date of Evaluation: 12/14/2021 ?Time spent:30 minutes ? ?Chief Complaint:  ?Chief Complaint   ?Depression; Anxiety; Follow-up ?  ? ? ? ? ?HISTORY/CURRENT STATUS: ?HPI  For routine med check. ? ?Has been under more stress over the past 4 or 5 months.  Her mom had to go into assisted living.  She lives in [place].  Brandy is having more symptoms of depression, just tired a lot, not wanting to do things like she normally did.  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  ADLs and personal hygiene are normal.  She is not isolating.  No suicidal or homicidal thoughts. ? ?Patient denies increased energy with decreased need for sleep, no increased talkativeness, no racing thoughts, no impulsivity or risky behaviors, no increased spending, no increased libido, no grandiosity, no increased irritability or anger, and no hallucinations. ? ?Does have anxiety at times, feeling of being over whelmed.  Not having panic attacks.  More of a generalized sense of unease.  She does take the Ativan occasionally. ? ?Denies dizziness, syncope, seizures, numbness, tingling, tremor, tics, unsteady gait, slurred speech, confusion. Denies muscle or joint pain, stiffness, or dystonia. ? ?Individual Medical History/ Review of Systems: Changes? :No  ? ?Past medications for mental health diagnoses include: ?Prozac, Ativan, Trazodone, Wellbutrin XL, Lexapro, Cymbalta, Viibryd Deplin was not beneficial ? ?Allergies: Patient has no known allergies. ? ?Current Medications:  ?Current Outpatient Medications:  ?  atenolol (TENORMIN) 25 MG tablet, Take 1 tablet (25 mg total) by mouth every evening. Please schedule office visit for further refills. Thank you!, Disp: 30 tablet, Rfl: 0 ?  ibuprofen (ADVIL,MOTRIN) 200 MG tablet, Take 600 mg by mouth 2 (two) times daily as needed for headache or moderate pain., Disp: , Rfl:  ?   Vilazodone HCl (VIIBRYD) 40 MG TABS, Take 1 tablet (40 mg total) by mouth daily., Disp: 90 tablet, Rfl: 1 ?  buPROPion (WELLBUTRIN XL) 150 MG 24 hr tablet, Take 3 tablets (450 mg total) by mouth daily., Disp: 270 tablet, Rfl: 1 ?  LORazepam (ATIVAN) 0.5 MG tablet, Take 0.5-1 tablets (0.25-0.5 mg total) by mouth every 8 (eight) hours as needed for anxiety., Disp: 30 tablet, Rfl: 1 ?  propranolol (INDERAL) 20 MG tablet, Take 1 tablet (20 mg total) by mouth 3 (three) times daily as needed. (Patient not taking: Reported on 01/05/2021), Disp: 90 tablet, Rfl: 6 ?  traZODone (DESYREL) 50 MG tablet, Take 50 mg by mouth at bedtime as needed for sleep. (Patient not taking: Reported on 12/14/2021), Disp: , Rfl:  ?Medication Side Effects: none ? ?Family Medical/ Social History: Changes? Mom had a stroke since our last visit but fortunately is able to live on her own.  Patient's son graduated from high school. ? ?MENTAL HEALTH EXAM: ? ?There were no vitals taken for this visit.There is no height or weight on file to calculate BMI.  ?General Appearance: Casual, Neat, and Well Groomed  ?Eye Contact:  Good  ?Speech:  Normal Rate  ?Volume:  Normal  ?Mood:  Depressed  ?Affect:  Congruent  ?Thought Process:  Goal Directed and Descriptions of Associations: Intact  ?Orientation:  Full (Time, Place, and Person)  ?Thought Content: Logical   ?Suicidal Thoughts:  No  ?Homicidal Thoughts:  No  ?Memory:  WNL  ?Judgement:  Good  ?Insight:  Good  ?Psychomotor Activity:  Normal  ?Concentration:  Concentration:  Good  ?Recall:  Good  ?Fund of Knowledge: Good  ?Language: Good  ?Assets:  Desire for Improvement  ?ADL's:  Intact  ?Cognition: WNL  ?Prognosis:  Good  ? ?Gene site test results from 01/05/2021: ?See on chart under media. ? ?DIAGNOSES:  ?  ICD-10-CM   ?1. Recurrent major depression resistant to treatment [facility])  F33.9   ?  ?2. Generalized anxiety disorder  F41.1   ?  ?3. Insomnia, unspecified type  G47.00   ?  ?4. MTHFR mutation  Z15.89   ?   ? ? ?Receiving Psychotherapy: No  ? ? ?RECOMMENDATIONS:  ?PDMP was reviewed.  Last Ativan filled 11/21/2021.   ?I provided 30  minutes of face to face time during this encounter, including time spent before and after the visit in records review, medical decision making, counseling pertinent to today's visit, and charting.  ?We discussed different options for treating the depression.  She has responded well to the [place], initially started in May 2022.  Recommend increasing to the maximum dose.  We briefly discussed the option of [place]rav[place].  She may be interested if the Viibryd does not help.  Pamphlet about Spravato was given. ? ?Increase viibryd to 40 mg, 1 p.o. daily. ?Continue Wellbutrin XL 150 mg, 3 p.o. every morning. ?Continue Ativan 0.5 mg, 1/2-1 p.o. 3 times daily as needed.  She takes it very rarely. ?Return in 6 weeks. ? ?Donnal Moat, PA-C  ?

## 2021-12-14 NOTE — Telephone Encounter (Signed)
Has appt with TH today.  ?

## 2022-01-26 ENCOUNTER — Ambulatory Visit: Payer: No Typology Code available for payment source | Admitting: Physician Assistant

## 2022-02-14 ENCOUNTER — Ambulatory Visit (INDEPENDENT_AMBULATORY_CARE_PROVIDER_SITE_OTHER): Payer: No Typology Code available for payment source | Admitting: Physician Assistant

## 2022-02-14 ENCOUNTER — Encounter: Payer: Self-pay | Admitting: Physician Assistant

## 2022-02-14 DIAGNOSIS — F339 Major depressive disorder, recurrent, unspecified: Secondary | ICD-10-CM

## 2022-02-14 DIAGNOSIS — Z1589 Genetic susceptibility to other disease: Secondary | ICD-10-CM | POA: Diagnosis not present

## 2022-02-14 DIAGNOSIS — F411 Generalized anxiety disorder: Secondary | ICD-10-CM | POA: Diagnosis not present

## 2022-02-14 MED ORDER — BUPROPION HCL ER (XL) 150 MG PO TB24: 300.0000 mg | ORAL_TABLET | Freq: Every day | ORAL | 1 refills | Status: DC

## 2022-02-14 NOTE — Progress Notes (Signed)
[facility]  Patient ID: Suzanne Foster,  MRN: [id number]  PCP: Kathreen Devoid, PA-C  Date of Evaluation: 02/14/2022 Time spent:20 minutes  Chief Complaint:  Chief Complaint   Anxiety; Depression; Follow-up     HISTORY/CURRENT STATUS: HPI  For routine med check.  Viibryd was increased 2 months ago and she is doing better as far as depression goes.  She is able to enjoy things.  Energy and motivation are not as good as she would like some days.  Appetite is normal and weight is stable.  ADLs and personal hygiene are normal.  She is not isolating.  Work is going well although it can be very stressful.  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  No suicidal or homicidal thoughts.  Has been more anxious, for a number of months, it is difficult to say how long.  She does have some stressors within her family, her mom is in assisted living now and patient's brother is power of attorney.  Her mom does not want to be "parented."  Suzanne Foster has an internal sense of unease quite often.  Her mind will not turn off either.  She will often wake up in the middle of the night ruminating about things.  She does take the Ativan occasionally and it is helpful.  She does not like to depend on it though.  Denies dizziness, syncope, seizures, numbness, tingling, tremor, tics, unsteady gait, slurred speech, confusion. Denies muscle or joint pain, stiffness, or dystonia.  Individual Medical History/ Review of Systems: Changes? :No   Past medications for mental health diagnoses include: Prozac, Ativan, Trazodone, Wellbutrin XL, Lexapro, Cymbalta, Viibryd Deplin was not beneficial  Allergies: Patient has no known allergies.  Current Medications:  Current Outpatient Medications:    atenolol (TENORMIN) 25 MG tablet, Take 1 tablet (25 mg total) by mouth every evening. Please schedule office visit for further refills. Thank you!, Disp: 30 tablet, Rfl: 0    cholecalciferol (VITAMIN D3) 25 MCG (1000 UNIT) tablet, Take 2,000 Units by mouth daily., Disp: , Rfl:    ibuprofen (ADVIL,MOTRIN) 200 MG tablet, Take 600 mg by mouth 2 (two) times daily as needed for headache or moderate pain., Disp: , Rfl:    levocetirizine (XYZAL ALLERGY 24HR) 5 MG tablet, Take 5 mg by mouth every evening., Disp: , Rfl:    LORazepam (ATIVAN) 0.5 MG tablet, Take 0.5-1 tablets (0.25-0.5 mg total) by mouth every 8 (eight) hours as needed for anxiety., Disp: 30 tablet, Rfl: 1   Vilazodone HCl (VIIBRYD) 40 MG TABS, Take 1 tablet (40 mg total) by mouth daily., Disp: 90 tablet, Rfl: 1   buPROPion (WELLBUTRIN XL) 150 MG 24 hr tablet, Take 2 tablets (300 mg total) by mouth daily., Disp: 180 tablet, Rfl: 1   propranolol (INDERAL) 20 MG tablet, Take 1 tablet (20 mg total) by mouth 3 (three) times daily as needed. (Patient not taking: Reported on 01/05/2021), Disp: 90 tablet, Rfl: 6   traZODone (DESYREL) 50 MG tablet, Take 50 mg by mouth at bedtime as needed for sleep. (Patient not taking: Reported on 12/14/2021), Disp: , Rfl:  Medication Side Effects: none  Family Medical/ Social History: Changes? Mom had a stroke since our last visit but fortunately is able to live on her own.  Patient's son graduated from high school.  MENTAL HEALTH EXAM:  There were no vitals taken for this visit.There is no height or weight on file to calculate BMI.  General Appearance:  Casual, Neat, and Well Groomed  Eye Contact:  Good  Speech:  Normal Rate  Volume:  Normal  Mood:  Euthymic  Affect:  Congruent  Thought Process:  Goal Directed and Descriptions of Associations: Intact  Orientation:  Full (Time, Place, and Person)  Thought Content: Logical   Suicidal Thoughts:  No  Homicidal Thoughts:  No  Memory:  WNL  Judgement:  Good  Insight:  Good  Psychomotor Activity:  Normal  Concentration:  Concentration: Good  Recall:  Good  Fund of Knowledge: Good  Language: Good  Assets:  Desire for Improvement   ADL's:  Intact  Cognition: WNL  Prognosis:  Good   Gene site test results from 01/05/2021: See on chart under media.  DIAGNOSES:    ICD-10-CM   1. Recurrent major depression resistant to treatment ([place])  F33.9     2. Generalized anxiety disorder  F41.1     3. MTHFR mutation  Z15.89        Receiving Psychotherapy: No    RECOMMENDATIONS:  PDMP was reviewed.  Last Ativan filled 12/15/2021. I provided 20 minutes of face to face time during this encounter, including time spent before and after the visit in records review, medical decision making, counseling pertinent to today's visit, and charting.  We discussed the anxiety.  She has been on Wellbutrin for several years at this dose, but it could possibly be activating her too much.  I recommend decreasing to a total of 300 mg and see how she does over the next month.  Unfortunately that may decrease energy and motivation though.  If the anxiety does not improve at all and her energy and motivation drop after decreasing the Wellbutrin dose she can go back up to a total of 450 mg.  She verbalizes understanding.  Decrease Wellbutrin XL to 300 mg every morning. Continue viibryd 40 mg, 1 p.o. daily. Continue Ativan 0.5 mg, 1/2-1 p.o. 3 times daily as needed.   Return in 6 months.  Donnal Moat, PA-C

## 2022-07-09 ENCOUNTER — Other Ambulatory Visit: Payer: Self-pay | Admitting: Physician Assistant

## 2022-08-17 ENCOUNTER — Ambulatory Visit: Payer: No Typology Code available for payment source | Admitting: Physician Assistant

## 2022-08-31 ENCOUNTER — Encounter: Payer: Self-pay | Admitting: Oncology

## 2022-09-02 ENCOUNTER — Encounter: Payer: Self-pay | Admitting: Oncology

## 2022-09-22 ENCOUNTER — Encounter: Payer: Self-pay | Admitting: Physician Assistant

## 2022-09-22 ENCOUNTER — Ambulatory Visit (INDEPENDENT_AMBULATORY_CARE_PROVIDER_SITE_OTHER): Payer: No Typology Code available for payment source | Admitting: Physician Assistant

## 2022-09-22 DIAGNOSIS — G4700 Insomnia, unspecified: Secondary | ICD-10-CM | POA: Diagnosis not present

## 2022-09-22 DIAGNOSIS — F3341 Major depressive disorder, recurrent, in partial remission: Secondary | ICD-10-CM | POA: Diagnosis not present

## 2022-09-22 DIAGNOSIS — F411 Generalized anxiety disorder: Secondary | ICD-10-CM | POA: Diagnosis not present

## 2022-09-22 MED ORDER — LORAZEPAM 0.5 MG PO TABS: 0.2500 mg | ORAL_TABLET | Freq: Three times a day (TID) | ORAL | 1 refills | Status: DC | PRN

## 2022-09-22 MED ORDER — VILAZODONE HCL 40 MG PO TABS: 40.0000 mg | ORAL_TABLET | Freq: Every day | ORAL | 1 refills | Status: DC

## 2022-09-22 MED ORDER — BUPROPION HCL ER (XL) 150 MG PO TB24: 300.0000 mg | ORAL_TABLET | Freq: Every day | ORAL | 1 refills | Status: DC

## 2022-09-22 NOTE — Progress Notes (Signed)
[facility]  Patient ID: RAFIA SHEDDEN,  MRN: [id number]  PCP: Kathreen Devoid, PA-C  Date of Evaluation: 09/22/2022 Time spent:20 minutes  Chief Complaint:  Chief Complaint   Anxiety; Depression; Follow-up    HISTORY/CURRENT STATUS: HPI  For routine med check.  Mother has been sick, in hospital, now home. She lives in [place], so pt has been worried and stressed. But thankful she's home now and seems to be doing ok.   Morgan is doing well. Stable. Feels like the meds are working as well as they can. Has situational stressors with work, single parent. But she is able to enjoy things.  Energy and motivation are good.   No extreme sadness, tearfulness, or feelings of hopelessness.  Sleeps well most of the time. ADLs and personal hygiene are normal.   Denies any changes in concentration, making decisions, or remembering things.  Has purposely lost weight over the past year. Needs the Ativan occas and it is helpful. Not often, no PA but will overwhelmed.  Denies suicidal or homicidal thoughts.  Patient denies increased energy with decreased need for sleep, increased talkativeness, racing thoughts, impulsivity or risky behaviors, increased spending, increased libido, grandiosity, increased irritability or anger, paranoia, or hallucinations.  Denies dizziness, syncope, seizures, numbness, tingling, tremor, tics, unsteady gait, slurred speech, confusion. Denies muscle or joint pain, stiffness, or dystonia.  Individual Medical History/ Review of Systems: Changes? :No   Past medications for mental health diagnoses include: Prozac, Ativan, Trazodone, Wellbutrin XL, Lexapro, Cymbalta, Viibryd Deplin was not beneficial  Allergies: Patient has no known allergies.  Current Medications:  Current Outpatient Medications:    atenolol (TENORMIN) 25 MG tablet, Take 1 tablet (25 mg total) by mouth every evening. Please schedule office visit for further refills. Thank you!, Disp: 30  tablet, Rfl: 0   cholecalciferol (VITAMIN D3) 25 MCG (1000 UNIT) tablet, Take 2,000 Units by mouth daily., Disp: , Rfl:    ibuprofen (ADVIL,MOTRIN) 200 MG tablet, Take 600 mg by mouth 2 (two) times daily as needed for headache or moderate pain., Disp: , Rfl:    levocetirizine (XYZAL ALLERGY 24HR) 5 MG tablet, Take 5 mg by mouth every evening., Disp: , Rfl:    buPROPion (WELLBUTRIN XL) 150 MG 24 hr tablet, Take 2 tablets (300 mg total) by mouth daily., Disp: 180 tablet, Rfl: 1   LORazepam (ATIVAN) 0.5 MG tablet, Take 0.5-1 tablets (0.25-0.5 mg total) by mouth every 8 (eight) hours as needed for anxiety., Disp: 30 tablet, Rfl: 1   Vilazodone HCl (VIIBRYD) 40 MG TABS, Take 1 tablet (40 mg total) by mouth daily., Disp: 90 tablet, Rfl: 1 Medication Side Effects: none  Family Medical/ Social History: Changes?   MENTAL HEALTH EXAM:  There were no vitals taken for this visit.There is no height or weight on file to calculate BMI.  General Appearance: Casual, Neat, and Well Groomed  Eye Contact:  Good  Speech:  Normal Rate  Volume:  Normal  Mood:  Euthymic  Affect:  Congruent  Thought Process:  Goal Directed and Descriptions of Associations: Intact  Orientation:  Full (Time, Place, and Person)  Thought Content: Logical   Suicidal Thoughts:  No  Homicidal Thoughts:  No  Memory:  WNL  Judgement:  Good  Insight:  Good  Psychomotor Activity:  Normal  Concentration:  Concentration: Good and Attention Span: Good  Recall:  Good  Fund of Knowledge: Good  Language: Good  Assets:  Desire for Improvement Financial Resources/Insurance Housing Transportation Vocational/Educational  ADL's:  Intact  Cognition: WNL  Prognosis:  Good   Gene site test results from 01/05/2021: See on chart under media.  DIAGNOSES:    ICD-10-CM   1. Recurrent major depressive disorder, in partial remission ([place])  F33.41     2. Generalized anxiety disorder  F41.1     3. Insomnia, unspecified type  G47.00        Receiving Psychotherapy: No   RECOMMENDATIONS:  PDMP was reviewed.  Last Ativan filled 06/19/2022. I provided 20 minutes of face to face time during this encounter, including time spent before and after the visit in records review, medical decision making, counseling pertinent to today's visit, and charting.   Overall she is doing well so no changes will be made.   Cont Wellbutrin XL 300 mg every morning. Continue Ativan 0.5 mg, 1/2-1 p.o. 3 times daily as needed.  Continue viibryd 40 mg, 1 p.o. daily.  Return in 6 months.  Donnal Moat, PA-C

## 2023-03-30 ENCOUNTER — Encounter: Payer: Self-pay | Admitting: Physician Assistant

## 2023-03-30 ENCOUNTER — Ambulatory Visit: Payer: No Typology Code available for payment source | Admitting: Physician Assistant

## 2023-03-30 DIAGNOSIS — G4700 Insomnia, unspecified: Secondary | ICD-10-CM | POA: Diagnosis not present

## 2023-03-30 DIAGNOSIS — F4323 Adjustment disorder with mixed anxiety and depressed mood: Secondary | ICD-10-CM | POA: Diagnosis not present

## 2023-03-30 MED ORDER — VILAZODONE HCL 40 MG PO TABS: 40.0000 mg | ORAL_TABLET | Freq: Every day | ORAL | 1 refills | Status: DC

## 2023-03-30 MED ORDER — BUPROPION HCL ER (XL) 300 MG PO TB24: 300.0000 mg | ORAL_TABLET | Freq: Every day | ORAL | 1 refills | Status: DC

## 2023-03-30 MED ORDER — VILAZODONE HCL 10 MG PO TABS: 10.0000 mg | ORAL_TABLET | Freq: Every day | ORAL | 1 refills | Status: DC

## 2023-03-30 MED ORDER — LORAZEPAM 0.5 MG PO TABS: 0.2500 mg | ORAL_TABLET | Freq: Three times a day (TID) | ORAL | 1 refills | Status: DC | PRN

## 2023-03-30 NOTE — Progress Notes (Unsigned)
[facility]  Patient ID: Suzanne Foster,  MRN: [id number]  PCP: Clemencia Course, PA-C  Date of Evaluation: 03/30/2023 Time spent:20 minutes  Chief Complaint:  Chief Complaint   Anxiety; Depression; Follow-up    HISTORY/CURRENT STATUS: HPI  For routine med check.  Under more stress, feels down, sad. Her son is going off to college in 3 weeks.  Bittersweet. She's a single mom and it'll be very different at home. Energy and motivation are fair to good, depending on situation.  Work is going well but stressful.   No feelings of hopelessness.  Sleeps fairly well.  We attempted to decrease Wellbutrin but she wasn't able to so stayed at 300 mg.  ADLs and personal hygiene are normal.   Denies any changes in concentration, making decisions, or remembering things.  Appetite has not changed.  Weight is stable.  Has been more anxious in general d/t her son's leaving for college. Ativan is helpful. Denies suicidal or homicidal thoughts.  Patient denies increased energy with decreased need for sleep, increased talkativeness, racing thoughts, impulsivity or risky behaviors, increased spending, increased libido, grandiosity, increased irritability or anger, paranoia, or hallucinations.  Denies dizziness, syncope, seizures, numbness, tingling, tremor, tics, unsteady gait, slurred speech, confusion. Denies muscle or joint pain, stiffness, or dystonia.  Individual Medical History/ Review of Systems: Changes? :No   Past medications for mental health diagnoses include: Prozac, Ativan, Trazodone, Wellbutrin XL, Lexapro, Cymbalta, Viibryd Deplin was not beneficial  Allergies: Patient has no known allergies.  Current Medications:  Current Outpatient Medications:    atenolol (TENORMIN) 25 MG tablet, Take 1 tablet (25 mg total) by mouth every evening. Please schedule office visit for further refills. Thank you!, Disp: 30 tablet, Rfl: 0   buPROPion (WELLBUTRIN XL) 300 MG 24 hr tablet,  Take 1 tablet (300 mg total) by mouth daily., Disp: 90 tablet, Rfl: 1   ibuprofen (ADVIL,MOTRIN) 200 MG tablet, Take 600 mg by mouth 2 (two) times daily as needed for headache or moderate pain., Disp: , Rfl:    levocetirizine (XYZAL ALLERGY 24HR) 5 MG tablet, Take 5 mg by mouth every evening., Disp: , Rfl:    Vilazodone HCl (VIIBRYD) 10 MG TABS, Take 1 tablet (10 mg total) by mouth daily., Disp: 90 tablet, Rfl: 1   cholecalciferol (VITAMIN D3) 25 MCG (1000 UNIT) tablet, Take 2,000 Units by mouth daily. (Patient not taking: Reported on 03/30/2023), Disp: , Rfl:    LORazepam (ATIVAN) 0.5 MG tablet, Take 0.5-1 tablets (0.25-0.5 mg total) by mouth every 8 (eight) hours as needed for anxiety., Disp: 30 tablet, Rfl: 1   Vilazodone HCl (VIIBRYD) 40 MG TABS, Take 1 tablet (40 mg total) by mouth daily., Disp: 90 tablet, Rfl: 1 Medication Side Effects: none  Family Medical/ Social History: Changes?   MENTAL HEALTH EXAM:  There were no vitals taken for this visit.There is no height or weight on file to calculate BMI.  General Appearance: Casual, Neat, and Well Groomed  Eye Contact:  Good  Speech:  Normal Rate  Volume:  Normal  Mood:   sad  Affect:  Congruent  Thought Process:  Goal Directed and Descriptions of Associations: Intact  Orientation:  Full (Time, Place, and Person)  Thought Content: Logical   Suicidal Thoughts:  No  Homicidal Thoughts:  No  Memory:  WNL  Judgement:  Good  Insight:  Good  Psychomotor Activity:  Normal  Concentration:  Concentration: Good and Attention Span: Good  Recall:  Good  Fund of  Knowledge: Good  Language: Good  Assets:  Desire for Improvement Financial Resources/Insurance Housing Transportation Vocational/Educational  ADL's:  Intact  Cognition: WNL  Prognosis:  Good   Gene site test results from 01/05/2021: See on chart under media.  DIAGNOSES:    ICD-10-CM   1. Situational mixed anxiety and depressive disorder  F43.23     2. Insomnia, unspecified  type  G47.00       Receiving Psychotherapy: No   RECOMMENDATIONS:  PDMP was reviewed.  Last Ativan filled 09/29/2022. I provided 20 minutes of face to face time during this encounter, including time spent before and after the visit in records review, medical decision making, counseling pertinent to today's visit, and charting.   Discussed her sx. She has responded well to viibryd but is at what's considered usual max dose. After reviewing Dr. Era Bumpers info, I feel comfortable increasing slightly. She understands and agrees to try. I'm afraid increasing Wellbutrin may make her more anxious, but it will be an option.   Cont Wellbutrin XL 300 mg every morning. Continue Ativan 0.5 mg, 1/2-1 p.o. 3 times daily as needed.  Increase viibryd to 50 mg daily. (Note Dr. Garry Heater prescribers guide states that doses higher than 40 mg may be required for some patients.)   Return in 6 weeks.  Melony Overly, PA-C

## 2023-05-17 ENCOUNTER — Ambulatory Visit: Payer: No Typology Code available for payment source | Admitting: Physician Assistant

## 2023-06-14 ENCOUNTER — Ambulatory Visit: Payer: No Typology Code available for payment source | Admitting: Physician Assistant

## 2023-09-07 ENCOUNTER — Encounter: Payer: Self-pay | Admitting: Physician Assistant

## 2023-09-07 ENCOUNTER — Ambulatory Visit (INDEPENDENT_AMBULATORY_CARE_PROVIDER_SITE_OTHER): Payer: No Typology Code available for payment source | Admitting: Physician Assistant

## 2023-09-07 DIAGNOSIS — F411 Generalized anxiety disorder: Secondary | ICD-10-CM

## 2023-09-07 DIAGNOSIS — G4700 Insomnia, unspecified: Secondary | ICD-10-CM | POA: Diagnosis not present

## 2023-09-07 DIAGNOSIS — F3341 Major depressive disorder, recurrent, in partial remission: Secondary | ICD-10-CM | POA: Diagnosis not present

## 2023-09-07 MED ORDER — BUPROPION HCL ER (XL) 300 MG PO TB24: 300.0000 mg | ORAL_TABLET | Freq: Every day | ORAL | 1 refills | Status: DC

## 2023-09-07 MED ORDER — VILAZODONE HCL 40 MG PO TABS: 40.0000 mg | ORAL_TABLET | Freq: Every day | ORAL | 1 refills | Status: DC

## 2023-09-07 NOTE — Progress Notes (Unsigned)
[facility]  Patient ID: Suzanne Foster,  MRN: [id number]  PCP: Clemencia Course, PA-C  Date of Evaluation: 09/07/2023 Time spent:20 minutes  Chief Complaint:  Chief Complaint   Anxiety; Depression; Follow-up    HISTORY/CURRENT STATUS: HPI  For routine med check.  Doing well with meds. Son went off to college and she's handling it better than she thought she would.  He's at [employer].  Patient is able to enjoy things.  Energy and motivation are good.  Work is going well. Stressful but ok. 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   No extreme sadness, tearfulness, or feelings of hopelessness.  Sleeps well most of the time. ADLs and personal hygiene are normal.   Denies any changes in concentration, making decisions, or remembering things.  Appetite has not changed.  Weight is stable. Anxiety is controlled.  No PA but gets overwhelmed sometimes.  Takes the Ativan occasionally and it is effective.  Denies suicidal or homicidal thoughts.  Patient denies increased energy with decreased need for sleep, increased talkativeness, racing thoughts, impulsivity or risky behaviors, increased spending, increased libido, grandiosity, increased irritability or anger, paranoia, or hallucinations.  Denies dizziness, syncope, seizures, numbness, tingling, tremor, tics, unsteady gait, slurred speech, confusion. Denies muscle or joint pain, stiffness, or dystonia.  Individual Medical History/ Review of Systems: Changes? :Yes   pain and mass in coccyx area, has seen several providers and had scans, nothing showed up abnl. Has to sit on a foam donut most of the time.   Past medications for mental health diagnoses include: Prozac, Ativan, Trazodone, Wellbutrin XL, Lexapro, Cymbalta, Viibryd Deplin was not beneficial  Allergies: Patient has no known allergies.  Current Medications:  Current Outpatient Medications:    atenolol (TENORMIN) 25 MG tablet, Take 1 tablet (25 mg total) by  mouth every evening. Please schedule office visit for further refills. Thank you!, Disp: 30 tablet, Rfl: 0   ibuprofen (ADVIL,MOTRIN) 200 MG tablet, Take 600 mg by mouth 2 (two) times daily as needed for headache or moderate pain., Disp: , Rfl:    levocetirizine (XYZAL ALLERGY 24HR) 5 MG tablet, Take 5 mg by mouth every evening., Disp: , Rfl:    LORazepam (ATIVAN) 0.5 MG tablet, Take 0.5-1 tablets (0.25-0.5 mg total) by mouth every 8 (eight) hours as needed for anxiety., Disp: 30 tablet, Rfl: 1   buPROPion (WELLBUTRIN XL) 300 MG 24 hr tablet, Take 1 tablet (300 mg total) by mouth daily., Disp: 90 tablet, Rfl: 1   cholecalciferol (VITAMIN D3) 25 MCG (1000 UNIT) tablet, Take 2,000 Units by mouth daily. (Patient not taking: Reported on 09/07/2023), Disp: , Rfl:    Vilazodone HCl (VIIBRYD) 40 MG TABS, Take 1 tablet (40 mg total) by mouth daily., Disp: 90 tablet, Rfl: 1 Medication Side Effects: none  Family Medical/ Social History: Changes?   MENTAL HEALTH EXAM:  There were no vitals taken for this visit.There is no height or weight on file to calculate BMI.  General Appearance: Casual, Neat, and Well Groomed  Eye Contact:  Good  Speech:  Normal Rate  Volume:  Normal  Mood:  Euthymic  Affect:  Congruent  Thought Process:  Goal Directed and Descriptions of Associations: Intact  Orientation:  Full (Time, Place, and Person)  Thought Content: Logical   Suicidal Thoughts:  No  Homicidal Thoughts:  No  Memory:  WNL  Judgement:  Good  Insight:  Good  Psychomotor Activity:  Normal  Concentration:  Concentration: Good and Attention Span:  Good  Recall:  Good  Fund of Knowledge: Good  Language: Good  Assets:  Desire for Improvement Financial Resources/Insurance Housing Transportation Vocational/Educational  ADL's:  Intact  Cognition: WNL  Prognosis:  Good   Gene site test results from 01/05/2021: See on chart under media.  DIAGNOSES:    ICD-10-CM   1. Recurrent major depressive  disorder, in partial remission (HCC)  F33.41     2. Generalized anxiety disorder  F41.1     3. Insomnia, unspecified type  G47.00      Receiving Psychotherapy: No   RECOMMENDATIONS:  PDMP was reviewed.  Last Ativan filled 08/20/2023. I provided 20 minutes of face to face time during this encounter, including time spent before and after the visit in records review, medical decision making, counseling pertinent to today's visit, and charting.   She's doing well so no changes will be made.   Cont Wellbutrin XL 300 mg every morning. Continue Ativan 0.5 mg, 1/2-1 p.o. 3 times daily as needed.  Continue viibryd 40 mg daily.  Return in 6 mo  Melony Overly, [place]

## 2023-09-14 ENCOUNTER — Ambulatory Visit: Payer: No Typology Code available for payment source | Admitting: Physician Assistant

## 2023-12-16 ENCOUNTER — Other Ambulatory Visit: Payer: Self-pay | Admitting: Physician Assistant

## 2024-03-07 ENCOUNTER — Ambulatory Visit: Payer: No Typology Code available for payment source | Admitting: Physician Assistant

## 2024-04-01 ENCOUNTER — Other Ambulatory Visit: Payer: Self-pay | Admitting: Physician Assistant

## 2024-04-10 ENCOUNTER — Encounter: Payer: Self-pay | Admitting: Physician Assistant

## 2024-04-10 ENCOUNTER — Ambulatory Visit (INDEPENDENT_AMBULATORY_CARE_PROVIDER_SITE_OTHER): Admitting: Physician Assistant

## 2024-04-10 DIAGNOSIS — G4700 Insomnia, unspecified: Secondary | ICD-10-CM

## 2024-04-10 DIAGNOSIS — F3342 Major depressive disorder, recurrent, in full remission: Secondary | ICD-10-CM

## 2024-04-10 DIAGNOSIS — F411 Generalized anxiety disorder: Secondary | ICD-10-CM

## 2024-04-10 MED ORDER — VILAZODONE HCL 40 MG PO TABS: 40.0000 mg | ORAL_TABLET | Freq: Every day | ORAL | 1 refills | Status: AC

## 2024-04-10 MED ORDER — BUPROPION HCL ER (XL) 300 MG PO TB24: 300.0000 mg | ORAL_TABLET | Freq: Every day | ORAL | 1 refills | Status: AC

## 2024-04-10 NOTE — Progress Notes (Signed)
 [facility]  Patient ID: DAILIN SOSNOWSKI,  MRN: [id number]  PCP: Garwin Lum Fuse, PA-C (Inactive)  Date of Evaluation: 04/10/2024 Time spent:20 minutes  Chief Complaint:  Chief Complaint   Anxiety; Depression; Follow-up    HISTORY/CURRENT STATUS: HPI  For routine med check.  Suzanne Foster is doing well as far as her meds go.  Energy and motivation are good for the most part.  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   No extreme sadness, tearfulness, or feelings of hopelessness.  Sleeps ok. ADLs and personal hygiene are normal.   No reports of lack of focus or memory deficits.  Appetite has not changed.  Weight is stable.  Still has anxiety but controlled. Ativan  is helpful for rescue.  No mania, delirium, AH/VH.  No SI/HI.  Son is starting his last year at [employer] this fall. Still not sure what he wants to do.    Denies dizziness, syncope, seizures, numbness, tingling, tremor, tics, unsteady gait, slurred speech, confusion. Denies muscle or joint pain, stiffness, or dystonia.  Individual Medical History/ Review of Systems: Changes? :Yes    dx w/ A Fib since LOV  Past medications for mental health diagnoses include: Prozac, Ativan , Trazodone , Wellbutrin  XL, Lexapro , Cymbalta , Viibryd  Deplin was not beneficial  Allergies: Patient has no known allergies.  Current Medications:  Current Outpatient Medications:    levocetirizine (XYZAL ALLERGY 24HR) 5 MG tablet, Take 5 mg by mouth every evening., Disp: , Rfl:    LORazepam  (ATIVAN ) 0.5 MG tablet, Take 0.5-1 tablets (0.25-0.5 mg total) by mouth every 8 (eight) hours as needed for anxiety., Disp: 30 tablet, Rfl: 5   metoprolol tartrate (LOPRESSOR) 25 MG tablet, Take 12.5 mg by mouth., Disp: , Rfl:    atenolol  (TENORMIN ) 25 MG tablet, Take 1 tablet (25 mg total) by mouth every evening. Please schedule office visit for further refills. Thank you! (Patient not taking: Reported on 04/10/2024), Disp:  30 tablet, Rfl: 0   buPROPion  (WELLBUTRIN  XL) 300 MG 24 hr tablet, Take 1 tablet (300 mg total) by mouth daily., Disp: 90 tablet, Rfl: 1   cholecalciferol (VITAMIN D3) 25 MCG (1000 UNIT) tablet, Take 2,000 Units by mouth daily. (Patient not taking: Reported on 09/07/2023), Disp: , Rfl:    ibuprofen (ADVIL,MOTRIN) 200 MG tablet, Take 600 mg by mouth 2 (two) times daily as needed for headache or moderate pain., Disp: , Rfl:    Vilazodone  HCl (VIIBRYD ) 40 MG TABS, Take 1 tablet (40 mg total) by mouth daily., Disp: 90 tablet, Rfl: 1 Medication Side Effects: none  Family Medical/ Social History: Changes? no  MENTAL HEALTH EXAM:  There were no vitals taken for this visit.There is no height or weight on file to calculate BMI.  General Appearance: Casual, Neat, and Well Groomed  Eye Contact:  Good  Speech:  Normal Rate  Volume:  Normal  Mood:  Euthymic  Affect:  Congruent  Thought Process:  Goal Directed and Descriptions of Associations: Intact  Orientation:  Full (Time, Place, and Person)  Thought Content: Logical   Suicidal Thoughts:  No  Homicidal Thoughts:  No  Memory:  WNL  Judgement:  Good  Insight:  Good  Psychomotor Activity:  Normal  Concentration:  Concentration: Good and Attention Span: Good  Recall:  Good  Fund of Knowledge: Good  Language: Good  Assets:  Desire for Improvement Financial Resources/Insurance Housing Transportation Vocational/Educational  ADL's:  Intact  Cognition: WNL  Prognosis:  Good  Gene site test results from 01/05/2021: See on chart under media.  DIAGNOSES:    ICD-10-CM   1. Recurrent major depression in full remission (HCC)  F33.42     2. Generalized anxiety disorder  F41.1     3. Insomnia, unspecified type  G47.00       Receiving Psychotherapy: No   RECOMMENDATIONS:  PDMP was reviewed.  Last Ativan  filled 03/20/2024. I provided approximately 20 minutes of face to face time during this encounter, including time spent before and after  the visit in records review, medical decision making, counseling pertinent to today's visit, and charting.   Jazmaine is doing well with the current treatment so no changes are needed.   Cont Wellbutrin  XL 300 mg every morning. Continue Ativan  0.5 mg, 1/2-1 p.o. 3 times daily as needed.  Continue viibryd  40 mg daily.  Return in 6 months.   Verneita Cooks, PA-C

## 2024-07-07 ENCOUNTER — Other Ambulatory Visit: Payer: Self-pay | Admitting: Physician Assistant

## 2024-07-07 DIAGNOSIS — F411 Generalized anxiety disorder: Secondary | ICD-10-CM

## 2024-07-08 NOTE — Telephone Encounter (Signed)
 LF 9/24 per PMPD, due 10/23

## 2024-10-09 ENCOUNTER — Ambulatory Visit: Admitting: Physician Assistant

## 2024-11-21 ENCOUNTER — Ambulatory Visit: Admitting: Physician Assistant
